# Patient Record
Sex: Male | Born: 1955 | Race: White | Hispanic: No | Marital: Married | Smoking: Former smoker
Health system: Southern US, Community
[De-identification: ages and names within clinical notes are randomized; demographics above are authoritative.]

## PROBLEM LIST (undated history)

## (undated) DIAGNOSIS — D490 Neoplasm of unspecified behavior of digestive system: Secondary | ICD-10-CM

## (undated) DIAGNOSIS — Z720 Tobacco use: Secondary | ICD-10-CM

## (undated) DIAGNOSIS — J939 Pneumothorax, unspecified: Secondary | ICD-10-CM

## (undated) DIAGNOSIS — I2510 Atherosclerotic heart disease of native coronary artery without angina pectoris: Secondary | ICD-10-CM

## (undated) DIAGNOSIS — I219 Acute myocardial infarction, unspecified: Secondary | ICD-10-CM

## (undated) DIAGNOSIS — S0300XA Dislocation of jaw, unspecified side, initial encounter: Secondary | ICD-10-CM

## (undated) DIAGNOSIS — E785 Hyperlipidemia, unspecified: Secondary | ICD-10-CM

## (undated) DIAGNOSIS — I10 Essential (primary) hypertension: Secondary | ICD-10-CM

## (undated) HISTORY — DX: Essential (primary) hypertension: I10

## (undated) HISTORY — DX: Hyperlipidemia, unspecified: E78.5

## (undated) HISTORY — DX: Atherosclerotic heart disease of native coronary artery without angina pectoris: I25.10

## (undated) HISTORY — PX: THORACOTOMY: SHX5074

## (undated) HISTORY — PX: TONSILLECTOMY: SUR1361

## (undated) HISTORY — DX: Tobacco use: Z72.0

---

## 2012-03-24 ENCOUNTER — Encounter (HOSPITAL_COMMUNITY): Payer: Self-pay | Admitting: *Deleted

## 2012-03-24 ENCOUNTER — Inpatient Hospital Stay (HOSPITAL_COMMUNITY)
Admission: EM | Admit: 2012-03-24 | Discharge: 2012-04-01 | DRG: 234 | Disposition: A | Discharge status: Home / Self Care | Payer: 59 | Source: Other Acute Inpatient Hospital | Attending: Cardiothoracic Surgery | Admitting: Cardiothoracic Surgery

## 2012-03-24 DIAGNOSIS — F17200 Nicotine dependence, unspecified, uncomplicated: Secondary | ICD-10-CM | POA: Diagnosis present

## 2012-03-24 DIAGNOSIS — I214 Non-ST elevation (NSTEMI) myocardial infarction: Principal | ICD-10-CM | POA: Diagnosis present

## 2012-03-24 DIAGNOSIS — Z8249 Family history of ischemic heart disease and other diseases of the circulatory system: Secondary | ICD-10-CM

## 2012-03-24 DIAGNOSIS — I498 Other specified cardiac arrhythmias: Secondary | ICD-10-CM | POA: Diagnosis present

## 2012-03-24 DIAGNOSIS — I2510 Atherosclerotic heart disease of native coronary artery without angina pectoris: Secondary | ICD-10-CM | POA: Diagnosis present

## 2012-03-24 DIAGNOSIS — R079 Chest pain, unspecified: Secondary | ICD-10-CM

## 2012-03-24 DIAGNOSIS — E785 Hyperlipidemia, unspecified: Secondary | ICD-10-CM | POA: Diagnosis present

## 2012-03-24 DIAGNOSIS — Z951 Presence of aortocoronary bypass graft: Secondary | ICD-10-CM

## 2012-03-24 DIAGNOSIS — D62 Acute posthemorrhagic anemia: Secondary | ICD-10-CM | POA: Diagnosis not present

## 2012-03-24 HISTORY — DX: Dislocation of jaw, unspecified side, initial encounter: S03.00XA

## 2012-03-24 HISTORY — DX: Pneumothorax, unspecified: J93.9

## 2012-03-24 MED ORDER — SODIUM CHLORIDE 0.9 % IV SOLN
250.0000 mL | INTRAVENOUS | Status: DC | PRN
  Administered 2012-03-24: 23:00:00 via INTRAVENOUS

## 2012-03-24 MED ORDER — SODIUM CHLORIDE 0.9 % IJ SOLN
3.0000 mL | Freq: Two times a day (BID) | INTRAMUSCULAR | Status: DC
  Administered 2012-03-24: 3 mL via INTRAVENOUS

## 2012-03-24 MED ORDER — HEPARIN (PORCINE) IN NACL 100-0.45 UNIT/ML-% IJ SOLN
1400.0000 [IU]/h | INTRAMUSCULAR | Status: DC
  Administered 2012-03-24: 1000 [IU]/h via INTRAVENOUS
  Filled 2012-03-24 (×3): qty 250

## 2012-03-24 MED ORDER — ACETAMINOPHEN 325 MG PO TABS
650.0000 mg | ORAL_TABLET | ORAL | Status: DC | PRN
  Administered 2012-03-25: 650 mg via ORAL
  Filled 2012-03-24: qty 2

## 2012-03-24 MED ORDER — ASPIRIN 81 MG PO CHEW
324.0000 mg | CHEWABLE_TABLET | ORAL | Status: AC
  Administered 2012-03-24: 324 mg via ORAL

## 2012-03-24 MED ORDER — ASPIRIN 81 MG PO CHEW
324.0000 mg | CHEWABLE_TABLET | ORAL | Status: AC
  Administered 2012-03-25: 324 mg via ORAL
  Filled 2012-03-24: qty 4

## 2012-03-24 MED ORDER — ASPIRIN 300 MG RE SUPP
300.0000 mg | RECTAL | Status: AC
  Filled 2012-03-24: qty 1

## 2012-03-24 MED ORDER — PNEUMOCOCCAL VAC POLYVALENT 25 MCG/0.5ML IJ INJ
0.5000 mL | INJECTION | INTRAMUSCULAR | Status: AC
  Filled 2012-03-24: qty 0.5

## 2012-03-24 MED ORDER — BIOTENE DRY MOUTH MT LIQD
15.0000 mL | Freq: Two times a day (BID) | OROMUCOSAL | Status: DC
  Administered 2012-03-24 – 2012-03-26 (×5): 15 mL via OROMUCOSAL

## 2012-03-24 MED ORDER — ASPIRIN EC 81 MG PO TBEC
81.0000 mg | DELAYED_RELEASE_TABLET | Freq: Every day | ORAL | Status: DC
  Administered 2012-03-26: 81 mg via ORAL
  Filled 2012-03-24 (×3): qty 1

## 2012-03-24 MED ORDER — SODIUM CHLORIDE 0.9 % IJ SOLN: 3.0000 mL | INTRAMUSCULAR | Status: DC | PRN

## 2012-03-24 MED ORDER — NITROGLYCERIN IN D5W 200-5 MCG/ML-% IV SOLN
3.0000 ug/min | INTRAVENOUS | Status: DC
Start: 2012-03-24 — End: 2012-03-27
  Administered 2012-03-24: 5 ug/min via INTRAVENOUS
  Filled 2012-03-24: qty 250

## 2012-03-24 MED ORDER — DIPHENHYDRAMINE HCL 25 MG PO CAPS
25.0000 mg | ORAL_CAPSULE | Freq: Every evening | ORAL | Status: DC | PRN
  Administered 2012-03-24 – 2012-03-25 (×2): 25 mg via ORAL
  Filled 2012-03-24 (×2): qty 1

## 2012-03-24 MED ORDER — ATORVASTATIN CALCIUM 80 MG PO TABS
80.0000 mg | ORAL_TABLET | Freq: Every day | ORAL | Status: DC
  Administered 2012-03-25 – 2012-03-31 (×6): 80 mg via ORAL
  Filled 2012-03-24 (×8): qty 1

## 2012-03-24 NOTE — Progress Notes (Signed)
ANTICOAGULATION CONSULT NOTE - Initial Consult  Pharmacy Consult for heparin Indication: chest pain/ACS  No Known Allergies  Patient Measurements: Height: 6\' 1"  (185.4 cm) Weight: 159 lb 6.3 oz (72.3 kg) IBW/kg (Calculated) : 79.9  Heparin Dosing Weight: 72kg  Vital Signs: BP: 125/85 mmHg (09/29 2300) Pulse Rate: 56  (09/29 2300)  Medical History: Past Medical History  Diagnosis Date  . 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 Pneumothorax     Assessment: 56yo male c/o CP x3 days progressively worsening, presented to [facility] where CE all positive though ECG unremarkable, tx'd to [facility] for evaluation for possible NSTEMI, to continue heparin begun there.  Of note no significant PMH except + tobacco abuse, no meds PTA.  Labs from [place]: troponin 1.36, WBC 11.1, H/H 16.3/48.4, Plt 240, glu 78, INR 0.9, SCr 0.88  Goal of Therapy:  Heparin level 0.3-0.7 units/ml Monitor platelets by anticoagulation protocol: Yes   Plan:  [place] gave heparin 5000 units x1 followed by gtt at 1000 units/hr; when pt arrived heparin was turned off due x~1hr due to OSH concentration incompatible w/ [facility] pumps, now to resume heparin at 1000 units/hr and monitor heparin levels and CBC.  Colleen Can PharmD BCPS 03/24/2012,11:14 PM

## 2012-03-24 NOTE — H&P (Signed)
History and Physical  Patient IDBrennan Litzinger Carey MRN: [id number], SOB: 12-24-1955 56 y.o. Date of Encounter: 03/24/2012, 11:28 PM  Primary Physician: Dr. Clelia Croft Primary Cardiologist: None  Chief Complaint: chest pain, xfer due to elevated troponins  HPI: 56 y.o. male w/ PMHx significant for tobacco abuse who presented to [facility] with 3 days of chest pain. Described that it started initially as globus, the sensation of something stuck in his throat. Over the next couple of days it worsened and then today on day of presentation it was typical symptoms of chest pain across the left side of his chest, radiating to his arms, diaphoresis and nausea. Sought care at [facility] and xferred after troponins found to be elevated. Started on heparin and nitro gtt as his initial BP was 180s.  In retrospect, he reports exertional chest discomfort for the last 3 or so years that he has ignored.  Outside labs: Troponin 1.36 CPK 284 MB 29 Fraction 10% BNP 72 D-dimer neg Hct 48 Platelets 240  Chem7 nml.  EKG revealed sinus brady with no acute ST changes. CXR was without acute cardiopulmonary abnormalities.   Past Medical History  Diagnosis Date  . 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 Pneumothorax      Surgical History:  Past Surgical History  Procedure Date  . Thoracotomy     about 10 yrs ago  . Tonsillectomy      Home Meds: Prior to Admission medications   Not on File  Benadryl prn  Allergies: No Known Allergies  History   Social History  . Marital Status: Married    Spouse Name: N/A    Number of Children: N/A  . Years of Education: N/A   Occupational History  . Not on file.   Social History Main Topics  . Smoking status: Current Every Day Smoker -- 1.5 packs/day for 30 years    Types: Cigarettes  . Smokeless tobacco: Never Used  . Alcohol Use: No  . Drug Use: No  . Sexually Active: Yes    Birth Control/ Protection: None   Other Topics Concern    . Not on file   Social History Narrative  . No narrative on file     Family History  Problem Relation Age of Onset  . Heart disease Other     Review of Systems: General: negative for chills, fever, night sweats or weight changes.  Cardiovascular: per HPI.  Dermatological: negative for rash. Multiple scratches from work Respiratory: negative for cough or wheezing Urologic: negative for hematuria Abdominal: negative for nausea, vomiting, diarrhea, bright red blood per rectum, melena, or hematemesis Neurologic: negative for visual changes, syncope, or dizziness All other systems reviewed and are otherwise negative except as noted above.  Labs:  see HPI  Radiology/Studies:  OSH pxcray: biapical scarring, no acute CV process   EKG: sinus brady otherwise wnl  Physical Exam: Blood pressure 125/85, pulse 56, resp. rate 17, height 6\' 1"  (1.854 m), weight 72.3 kg (159 lb 6.3 oz), SpO2 99.00%. General: Well developed, well nourished, in no acute distress. Head: Normocephalic, atraumatic, sclera non-icteric, nares are without discharge Neck: Supple. Negative for carotid bruits. JVD not elevated. Lungs: Clear bilaterally to auscultation without wheezes, rales, or rhonchi. Breathing is unlabored. Heart: RRR with S1 S2. No murmurs, rubs, or gallops appreciated. Abdomen: Soft, non-tender, non-distended with normoactive bowel sounds. No rebound/guarding. No obvious abdominal masses. Msk:  Strength and tone appear normal for age. Extremities: No edema. No clubbing or cyanosis. Distal pedal  pulses are 2+ and equal bilaterally. Neuro: Alert and oriented X 3. Moves all extremities spontaneously. Psych:  Responds to questions appropriately with a normal affect.    Problem List 1. NSTEMI, elevated troponin 2. Tobacco abuse 3. Sinus bradycardia   ASSESSMENT AND PLAN:  56 yo male with cardiac risk factors of family history and tobacco abuse presenting with 3 days of chest pain, acutely  worse today. Found to have elevated troponin consistent NSTEMI.  Will aggressively treat blood pressure with nitro gtt. Sinus bradycardia in the 50s precludes the use of beta blockers.  Anti-coagulation with aspirin, heparin gtt. Hold on clopidogrel until anatomy is known.  Lipids pending. Started high dose atorvastatin.  No contraindications to early invasive strategy with left heart catheterization and possible PCI. NPO, to be prepped and consented.  Signed, Adolm Joseph, Troy Sine MD 03/24/2012, 11:28 PM

## 2012-03-25 ENCOUNTER — Other Ambulatory Visit: Payer: Self-pay | Admitting: *Deleted

## 2012-03-25 ENCOUNTER — Encounter (HOSPITAL_COMMUNITY)
Admission: EM | Disposition: A | Discharge status: Home / Self Care | Payer: Self-pay | Source: Other Acute Inpatient Hospital | Attending: Cardiothoracic Surgery

## 2012-03-25 ENCOUNTER — Encounter (HOSPITAL_COMMUNITY): Payer: Self-pay | Admitting: Cardiology

## 2012-03-25 ENCOUNTER — Encounter (HOSPITAL_COMMUNITY): Payer: 59

## 2012-03-25 DIAGNOSIS — I219 Acute myocardial infarction, unspecified: Secondary | ICD-10-CM

## 2012-03-25 DIAGNOSIS — I359 Nonrheumatic aortic valve disorder, unspecified: Secondary | ICD-10-CM

## 2012-03-25 DIAGNOSIS — I2510 Atherosclerotic heart disease of native coronary artery without angina pectoris: Secondary | ICD-10-CM

## 2012-03-25 DIAGNOSIS — I214 Non-ST elevation (NSTEMI) myocardial infarction: Secondary | ICD-10-CM | POA: Diagnosis present

## 2012-03-25 HISTORY — PX: LEFT HEART CATHETERIZATION WITH CORONARY ANGIOGRAM: SHX5451

## 2012-03-25 LAB — BASIC METABOLIC PANEL
Calcium: 9.1 mg/dL (ref 8.4–10.5)
GFR calc Af Amer: >90 mL/min (ref >90)
GFR calc non Af Amer: >90 mL/min (ref >90)
Glucose, Bld: 93 mg/dL (ref 70–99)
Potassium: 3.6 mEq/L (ref 3.5–5.1)
Sodium: 138 mEq/L (ref 135–145)

## 2012-03-25 LAB — CBC
MCH: 29.6 pg (ref 26.0–34.0)
MCHC: 33.6 g/dL (ref 30.0–36.0)
Platelets: 207 10*3/uL (ref 150–400)
RDW: 13.5 % (ref 11.5–15.5)

## 2012-03-25 LAB — LIPID PANEL
Cholesterol: 246 mg/dL — ABNORMAL HIGH (ref 0–200)
LDL Cholesterol: 177 mg/dL — ABNORMAL HIGH (ref 0–99)
Total CHOL/HDL Ratio: 6.2 RATIO
Triglycerides: 147 mg/dL (ref <150)
VLDL: 29 mg/dL (ref 0–40)

## 2012-03-25 LAB — HEPARIN LEVEL (UNFRACTIONATED): Heparin Unfractionated: 0.15 IU/mL — ABNORMAL LOW (ref 0.30–0.70)

## 2012-03-25 LAB — TROPONIN I: Troponin I: 5.33 ng/mL (ref <0.30)

## 2012-03-25 LAB — MRSA PCR SCREENING: MRSA by PCR: NEGATIVE

## 2012-03-25 SURGERY — LEFT HEART CATHETERIZATION WITH CORONARY ANGIOGRAM
Anesthesia: LOCAL

## 2012-03-25 MED ORDER — LIDOCAINE HCL (PF) 1 % IJ SOLN
INTRAMUSCULAR | Status: AC
  Filled 2012-03-25: qty 30

## 2012-03-25 MED ORDER — HEPARIN (PORCINE) IN NACL 100-0.45 UNIT/ML-% IJ SOLN
1600.0000 [IU]/h | INTRAMUSCULAR | Status: DC
  Administered 2012-03-25: 1400 [IU]/h via INTRAVENOUS
  Administered 2012-03-27: 1600 [IU]/h via INTRAVENOUS
  Filled 2012-03-25 (×4): qty 250

## 2012-03-25 MED ORDER — NITROGLYCERIN 0.2 MG/ML ON CALL CATH LAB
INTRAVENOUS | Status: AC
  Filled 2012-03-25: qty 1

## 2012-03-25 MED ORDER — MIDAZOLAM HCL 2 MG/2ML IJ SOLN
INTRAMUSCULAR | Status: AC
  Filled 2012-03-25: qty 2

## 2012-03-25 MED ORDER — ONDANSETRON HCL 4 MG/2ML IJ SOLN: 4.0000 mg | Freq: Four times a day (QID) | INTRAMUSCULAR | Status: DC | PRN

## 2012-03-25 MED ORDER — SODIUM CHLORIDE 0.9 % IV SOLN
INTRAVENOUS | Status: AC
  Administered 2012-03-25: 12:00:00 via INTRAVENOUS

## 2012-03-25 MED ORDER — HEPARIN (PORCINE) IN NACL 2-0.9 UNIT/ML-% IJ SOLN
INTRAMUSCULAR | Status: AC
  Filled 2012-03-25: qty 1500

## 2012-03-25 MED ORDER — VERAPAMIL HCL 2.5 MG/ML IV SOLN
INTRAVENOUS | Status: AC
  Filled 2012-03-25: qty 2

## 2012-03-25 MED ORDER — FENTANYL CITRATE 0.05 MG/ML IJ SOLN
INTRAMUSCULAR | Status: AC
  Filled 2012-03-25: qty 2

## 2012-03-25 NOTE — CV Procedure (Signed)
   Cardiac Catheterization Procedure Note  Name: Shawn Carey MRN: [id number] DOB: 08/18/1955  Procedure: Left Heart Cath, Selective Coronary Angiography, LV angiography  Indication: NonSTEMI   Procedural details:   The right radial was prepped and draped, but the right radial could not be accessed.  The right groin was prepped, draped, and anesthetized with 1% lidocaine. Using modified Seldinger technique, a 5 French sheath was introduced into the right femoral artery. Standard Judkins catheters were used for coronary angiography and left ventriculography. Catheter exchanges were performed over a guidewire. There were no immediate procedural complications. The patient was transferred to the post catheterization recovery area for further monitoring.  ACT was appropriate for sheath removal.    Procedural Findings: Hemodynamics:    AO 135/80 (102) LV 131/6/11 NO gradient    Coronary angiography: Coronary dominance: right  Left mainstem: No significant obstruction  Left anterior descending (LAD): The LAD courses to the apex.  There is an 80% ulcerative plaque just before the diagonal and septal.  The diagonal has an upward takeoff with also 80% proximal stenosis.  The diagonal is fairly large and bifurcates distally.  The LAD beyond this then opens up, then bifurcates to two smaller branches which represent a distal LAD and diagonal.  There is bifurcational disease of 60-70%.  The twin distal vessels are smaller in caliber.    Left circumflex (LCx): The circumflex has an 80% stenosis at the origin of the OM1.  The OM1 has 99% narrowing with TIMI 1 flow and likely represents the infarct artery.  It is probably graftable.  The second OM is large, and graftable.  There is tandem diffuse plaque of 50% leading into the OM2.   Right coronary artery (RCA): Large caliber vessel.  There is an 80% segmental lesion in the mid and a hypodense, 80% lesion in the acute margin.  There is 60% plaque  between the origins of the PDA and PLA.  kf  Left ventriculography: Left ventricular systolic function is normal, LVEF is estimated at 45-50%, there is mild global hypokinesis.    Final Conclusions:   1.  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  Mild reduction in left ventricular systolic function.    Recommendations: 1.  TCTS consult.    [name] 03/25/2012, 10:46 AM

## 2012-03-25 NOTE — Progress Notes (Signed)
ANTICOAGULATION CONSULT NOTE - Follow Up Consult  Pharmacy Consult for heparin Indication: chest pain/ACS  No Known Allergies  Patient Measurements: Height: 6\' 1"  (185.4 cm) Weight: 159 lb 6.3 oz (72.3 kg) IBW/kg (Calculated) : 79.9  Heparin Dosing Weight: 72kg  Vital Signs: Temp: 98.2 F (36.8 C) (09/30 0349) Temp src: Oral (09/30 0349) BP: 126/83 mmHg (09/30 0400) Pulse Rate: 52  (09/30 0600)  Medical History: Past Medical History  Diagnosis Date  . 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 Pneumothorax     Assessment: 56yo male c/o CP x3 days progressively worsening, presented to [facility] where CE all positive though ECG unremarkable, tx'd to [facility] for evaluation for possible NSTEMI on 03/24/2012 , to continue heparin begun there.  Of note no significant PMH except + tobacco abuse, no meds PTA.  Heparin level reported < goal, infusing without problems, no bleeding noted.  Labs from [place]: troponin 1.36, WBC 11.1, H/H 16.3/48.4, Plt 240, glu 78, INR 0.9, SCr 0.88  Goal of Therapy:  Heparin level 0.3-0.7 units/ml Monitor platelets by anticoagulation protocol: Yes   Plan:  Increase heparin to 1400 units/hr Check heparin level in 6h T[place]ski heparin level and CBC  Thank you for allowing pharmacy to be a part of this patients care team.  Lovenia Kim Pharm.D., BCPS Clinical Pharmacist 03/25/2012 6:10 AM Pager: [phone] Phone: [phone]

## 2012-03-25 NOTE — Care Management Note (Unsigned)
    Page 1 of 1   03/28/2012     1:35:48 PM   CARE MANAGEMENT NOTE 03/28/2012  Patient:  WOOD, NOVACEK   Account Number:  [id number]  Date Initiated:  03/25/2012  Documentation initiated by:  Junius Creamer  Subjective/Objective Assessment:   adm w mi     Action/Plan:   lives w wife   Anticipated DC Date:  04/01/2012   Anticipated DC Plan:  HOME W HOME HEALTH SERVICES      DC Planning Services  CM consult      Choice offered to / List presented to:             Status of service:  In process, will continue to follow Medicare Important Message given?   (If response is "NO", the following Medicare IM given date fields will be blank) Date Medicare IM given:   Date Additional Medicare IM given:    Discharge Disposition:    Per UR Regulation:  Reviewed for med. necessity/level of care/duration of stay  If discussed at Long Length of Stay Meetings, dates discussed:    Comments:  9/30 11:55a debbie dowell rn,bsn [phone]   03/28/12 Taniya Dasher,RN,BSN [phone] PT S/P CABG X 7 ON 03/27/12.  PTA, PT INDEPENDENT, LIVES WITH SPOUSE.  MET WITH PT AND WIFE TO DISUSS DC PLANS. WIFE TO PROVIDE CARE AT DC.  WILL FOLLOW FOR HOME NEEDS AS PT PROGRESSES.

## 2012-03-25 NOTE — H&P (View-Only) (Signed)
SUBJECTIVE:  No chest pain.  No SOB.   PHYSICAL EXAM Filed Vitals:   03/25/12 0400 03/25/12 0500 03/25/12 0600 03/25/12 0700  BP: 126/83     Pulse: 51 61 52 50  Temp:      TempSrc:      Resp: 17 17 18 15   Height:      Weight:      SpO2: 99% 98% 98% 99%   General:  No distress Lungs:  Clear Heart:  RRR, no rub Abdomen:  Positive bowel sounds, no rebound no guarding Extremities:  No edema.    LABS: Lab Results  Component Value Date   TROPONINI 5.33* 03/25/2012   Results for orders placed during the hospital encounter of 03/24/12 (from the past 24 hour(s))  MRSA PCR SCREENING     Status: Normal   Collection Time   03/24/12 11:08 PM      Component Value Range   MRSA by PCR NEGATIVE  NEGATIVE  TROPONIN I     Status: Abnormal   Collection Time   03/24/12 11:14 PM      Component Value Range   Troponin I 3.96 (*) <0.30 ng/mL  APTT     Status: Abnormal   Collection Time   03/24/12 11:14 PM      Component Value Range   aPTT 71 (*) 24 - 37 seconds  TROPONIN I     Status: Abnormal   Collection Time   03/25/12  4:35 AM      Component Value Range   Troponin I 5.33 (*) <0.30 ng/mL  LIPID PANEL     Status: Abnormal   Collection Time   03/25/12  4:35 AM      Component Value Range   Cholesterol 246 (*) 0 - 200 mg/dL   Triglycerides 413  <244 mg/dL   HDL 40  >01 mg/dL   Total CHOL/HDL Ratio 6.2     VLDL 29  0 - 40 mg/dL   LDL Cholesterol 027 (*) 0 - 99 mg/dL  CBC     Status: Normal   Collection Time   03/25/12  4:35 AM      Component Value Range   WBC 9.7  4.0 - 10.5 K/uL   RBC 4.93  4.22 - 5.81 MIL/uL   Hemoglobin 14.6  13.0 - 17.0 g/dL   HCT 25.3  66.4 - 40.3 %   MCV 88.2  78.0 - 100.0 fL   MCH 29.6  26.0 - 34.0 pg   MCHC 33.6  30.0 - 36.0 g/dL   RDW 47.4  25.9 - 56.3 %   Platelets 207  150 - 400 K/uL  BASIC METABOLIC PANEL     Status: Normal   Collection Time   03/25/12  4:35 AM      Component Value Range   Sodium 138  135 - 145 mEq/L   Potassium 3.6  3.5 - 5.1  mEq/L   Chloride 103  96 - 112 mEq/L   CO2 27  19 - 32 mEq/L   Glucose, Bld 93  70 - 99 mg/dL   BUN 12  6 - 23 mg/dL   Creatinine, Ser 8.75  0.50 - 1.35 mg/dL   Calcium 9.1  8.4 - 64.3 mg/dL   GFR calc non Af Amer >90  >90 mL/min   GFR calc Af Amer >90  >90 mL/min  HEPARIN LEVEL (UNFRACTIONATED)     Status: Abnormal   Collection Time   03/25/12  4:35 AM  Component Value Range   Heparin Unfractionated 0.15 (*) 0.30 - 0.70 IU/mL    Intake/Output Summary (Last 24 hours) at 03/25/12 0811 Last data filed at 03/25/12 0700  Gross per 24 hour  Intake  158.6 ml  Output    900 ml  Net -741.4 ml    EKG:  Bradycardia.  Rate 51.  No acute changes.    ASSESSMENT AND PLAN:  1. NSTEMI, elevated troponin:  No further pain.  Cath today. The patient understands that risks included but are not limited to stroke (1 in 1000), death (1 in 1000), kidney failure [usually temporary] (1 in 500), bleeding (1 in 200), allergic reaction [possibly serious] (1 in 200).  The patient understands and agrees to proceed.   2. Tobacco abuse :  Educated  3. Sinus bradycardia:  No symptoms related to this.  Avoiding beta blockers.  4.  Hyperlipidemia:  LDL 177.  Started on statin.      Fayrene Fearing [facility] 03/25/2012 8:11 AM

## 2012-03-25 NOTE — Progress Notes (Signed)
ANTICOAGULATION CONSULT NOTE - Follow Up Consult  Pharmacy Consult for heparin Indication: chest pain/ACS  No Known Allergies  Patient Measurements: Height: 6\' 1"  (185.4 cm) Weight: 159 lb 6.3 oz (72.3 kg) IBW/kg (Calculated) : 79.9  Heparin Dosing Weight: 72kg  Vital Signs: Temp: 97.8 F (36.6 C) (09/30 1115) Temp src: Oral (09/30 1115) BP: 130/79 mmHg (09/30 1115) Pulse Rate: 51  (09/30 1115)  Medical History: Past Medical History  Diagnosis Date  . 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 Pneumothorax     Assessment: 56yo male c/o CP x3 days progressively worsening, presented to [facility] where CE all positive though ECG unremarkable, tx'd to [facility] for evaluation for possible NSTEMI on 03/24/2012 , to continue heparin begun there.  Of note no significant PMH except + tobacco abuse, no meds PTA. Patient is now s/p LHC indicative of severe three vessel CAD with complex LAD bifurcation disease. Planning for TCTS consult  Heparin level reported < goal (rate adjusted to 1400units after level resulted), infusing without problems, no bleeding noted.  Labs from [place]: troponin 1.36, WBC 11.1, H/H 16.3/48.4, Plt 240, glu 78, INR 0.9, SCr 0.88  Goal of Therapy:  Heparin level 0.3-0.5 Monitor platelets by anticoagulation protocol: Yes   Plan:  -Resume heparin at 1400 units/hr 8 hours post sheath removal (18:30) -Check heparin level in 6h -[place]awaz heparin level and CBC  Thank you for allowing pharmacy to be a part of this patients care team.  Franchot Erichsen, Pharm.D. Clinical Pharmacist   Pager: [phone] 03/25/2012 11:35 AM

## 2012-03-25 NOTE — Progress Notes (Signed)
 SUBJECTIVE:  No chest pain.  No SOB.   PHYSICAL EXAM Filed Vitals:   03/25/12 0400 03/25/12 0500 03/25/12 0600 03/25/12 0700  BP: 126/83     Pulse: 51 61 52 50  Temp:      TempSrc:      Resp: 17 17 18 15  Height:      Weight:      SpO2: 99% 98% 98% 99%   General:  No distress Lungs:  Clear Heart:  RRR, no rub Abdomen:  Positive bowel sounds, no rebound no guarding Extremities:  No edema.    LABS: Lab Results  Component Value Date   TROPONINI 5.33* 03/25/2012   Results for orders placed during the hospital encounter of 03/24/12 (from the past 24 hour(s))  MRSA PCR SCREENING     Status: Normal   Collection Time   03/24/12 11:08 PM      Component Value Range   MRSA by PCR NEGATIVE  NEGATIVE  TROPONIN I     Status: Abnormal   Collection Time   03/24/12 11:14 PM      Component Value Range   Troponin I 3.96 (*) <0.30 ng/mL  APTT     Status: Abnormal   Collection Time   03/24/12 11:14 PM      Component Value Range   aPTT 71 (*) 24 - 37 seconds  TROPONIN I     Status: Abnormal   Collection Time   03/25/12  4:35 AM      Component Value Range   Troponin I 5.33 (*) <0.30 ng/mL  LIPID PANEL     Status: Abnormal   Collection Time   03/25/12  4:35 AM      Component Value Range   Cholesterol 246 (*) 0 - 200 mg/dL   Triglycerides 147  <150 mg/dL   HDL 40  >39 mg/dL   Total CHOL/HDL Ratio 6.2     VLDL 29  0 - 40 mg/dL   LDL Cholesterol 177 (*) 0 - 99 mg/dL  CBC     Status: Normal   Collection Time   03/25/12  4:35 AM      Component Value Range   WBC 9.7  4.0 - 10.5 K/uL   RBC 4.93  4.22 - 5.81 MIL/uL   Hemoglobin 14.6  13.0 - 17.0 g/dL   HCT 43.5  39.0 - 52.0 %   MCV 88.2  78.0 - 100.0 fL   MCH 29.6  26.0 - 34.0 pg   MCHC 33.6  30.0 - 36.0 g/dL   RDW 13.5  11.5 - 15.5 %   Platelets 207  150 - 400 K/uL  BASIC METABOLIC PANEL     Status: Normal   Collection Time   03/25/12  4:35 AM      Component Value Range   Sodium 138  135 - 145 mEq/L   Potassium 3.6  3.5 - 5.1  mEq/L   Chloride 103  96 - 112 mEq/L   CO2 27  19 - 32 mEq/L   Glucose, Bld 93  70 - 99 mg/dL   BUN 12  6 - 23 mg/dL   Creatinine, Ser 0.84  0.50 - 1.35 mg/dL   Calcium 9.1  8.4 - 10.5 mg/dL   GFR calc non Af Amer >90  >90 mL/min   GFR calc Af Amer >90  >90 mL/min  HEPARIN LEVEL (UNFRACTIONATED)     Status: Abnormal   Collection Time   03/25/12  4:35 AM        Component Value Range   Heparin Unfractionated 0.15 (*) 0.30 - 0.70 IU/mL    Intake/Output Summary (Last 24 hours) at 03/25/12 0811 Last data filed at 03/25/12 0700  Gross per 24 hour  Intake  158.6 ml  Output    900 ml  Net -741.4 ml    EKG:  Bradycardia.  Rate 51.  No acute changes.    ASSESSMENT AND PLAN:  1. NSTEMI, elevated troponin:  No further pain.  Cath today. The patient understands that risks included but are not limited to stroke (1 in 1000), death (1 in 1000), kidney failure [usually temporary] (1 in 500), bleeding (1 in 200), allergic reaction [possibly serious] (1 in 200).  The patient understands and agrees to proceed.   2. Tobacco abuse :  Educated  3. Sinus bradycardia:  No symptoms related to this.  Avoiding beta blockers.  4.  Hyperlipidemia:  LDL 177.  Started on statin.      Shawn Carey 03/25/2012 8:11 AM   

## 2012-03-25 NOTE — Interval H&P Note (Signed)
History and Physical Interval Note:  03/25/2012 9:18 AM  Shawn Carey  has presented today for surgery, with the diagnosis of cp  The various methods of treatment have been discussed with the patient. After consideration of risks, benefits and other options for treatment, the patient has consented to  Procedure(s) (LRB) with comments: LEFT HEART CATHETERIZATION WITH CORONARY ANGIOGRAM (N/A) as a surgical intervention .  The patient's history has been reviewed, patient examined, no change in status, stable for surgery.  I have reviewed the patient's chart and labs.  Questions were answered to the patient's satisfaction.    History reviewed and case discussed with patient.  He has a normal ECG, but pos enzymes consistent with NSTEMI.  Cath is indicated for definition of his anatomy.  He is agreeable.     [name]

## 2012-03-25 NOTE — Progress Notes (Signed)
Stable post cath.  Films reviewed with EG.  Plan is for CABG on Wednesday.  Patient is agreeable.

## 2012-03-25 NOTE — Progress Notes (Signed)
Pt received from [facility] awake and alert, denies cp @ present. Cardiac monitor on, Initial assessment and database completed. Dr Adolm Joseph @ bs. Aware of NPO status, for cath in am. Wife called and updated. Call light within reach, will monitor.

## 2012-03-25 NOTE — Consult Note (Signed)
[address]            Shawn [place] [address]          [phone]    PCP is Dr Clelia Croft, [facility] Referring Provider is Dr Riley Kill, admitted by Dr Tenny Craw  History of Presenting Illness: This is a 56 year old Caucasian male with a past medical history significant for tobacco abuse who presented to [facility] on 03/24/2012 with complaints 3 days of chest pain. He stated that it started initially as the sensation of something stuck in his throat and some radiation to his neck. Over the next couple of days, it worsened and then on the day of presentation, it was  chest pain across the left side of his chest, radiating to his arms, diaphoresis and nausea .In retrospect, he reported exertional chest discomfort for the last 3 or so years that he has ignored. He initially sought care at [facility]. Cardiac enzymes showed a Troponin I 1.36, CPK MB 29.6, relative index 10.4, and total CPK 284. after troponins found to be elevated. He ruled in for a NSTEMI. He was then transferred to [facility] for further evaluation and treatment.He was started on heparin and nitro gtt as his initial BP was 180s.  He undersent a cardiac catheterization by Dr. Riley Kill earlier today. He was found to have three vessel coronary artery disease. A cardiothoracic consultation was requested for consideration of coronary artery bypass surgery. Currently, his vital signs are stable and he denies chest pain or shortness of breath.   Past Medical History  Diagnosis Date  . 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 Pneumothorax     Past Surgical History  Procedure Date  . Chest Tube placement left chest     about 10 yrs ago  . Tonsillectomy     Family History  Problem Relation Age of Onset  . Heart disease Maternal grandmother   Mother is deceased and she had diabetes His father's medical history is not known  Social History History  Substance Use Topics  . Smoking  status: Current Every Day Smoker -- 1.5 packs/day for 30 years    Types: Cigarettes  . Smokeless tobacco: Never Used  . Alcohol Use: No    Current Facility-Administered Medications  Medication Dose Route Frequency Provider Last Rate Last Dose  . 0.9 %  sodium chloride infusion   Intravenous Continuous [name], MD      . acetaminophen (TYLENOL) tablet 650 mg  650 mg Oral Q4H PRN Ardis Rowan, MD      . antiseptic oral rinse (BIOTENE) solution 15 mL  15 mL Mouth Rinse BID Pricilla Riffle, MD   15 mL at 03/25/12 0800  . aspirin chewable tablet 324 mg  324 mg Oral Pre-Cath Ardis Rowan, MD   324 mg at 03/25/12 0530  . aspirin chewable tablet 324 mg  324 mg Oral NOW Ardis Rowan, MD   324 mg at 03/24/12 2315   Or  . aspirin suppository 300 mg  300 mg Rectal NOW Ardis Rowan, MD      . aspirin EC tablet 81 mg  81 mg Oral Barcenas Ardis Rowan, MD      . atorvastatin (LIPITOR) tablet 80 mg  80 mg Oral q1800 Ardis Rowan, MD      . diphenhydrAMINE (BENADRYL) capsule 25 mg  25 mg Oral QHS PRN Molli Hazard  Adolm Joseph, MD   25 mg at 03/24/12 2325  . fentaNYL (SUBLIMAZE) 0.05 MG/ML injection           . heparin 2-0.9 UNIT/ML-% infusion           . heparin ADULT infusion 100 units/mL (25000 units/250 mL)  1,400 Units/hr Intravenous Continuous Pricilla Riffle, MD      . lidocaine (XYLOCAINE) 1 % injection           . midazolam (VERSED) 2 MG/2ML injection           . nitroGLYCERIN (NTG ON-CALL) 0.2 mg/mL injection           . nitroGLYCERIN 0.2 mg/mL in dextrose 5 % infusion  3-30 mcg/min Intravenous Titrated Ardis Rowan, MD 1.5 mL/hr at 03/24/12 2314 5 mcg/min at 03/24/12 2314  . ondansetron (ZOFRAN) injection 4 mg  4 mg Intravenous Q6H PRN [name], MD      . pneumococcal 23 valent vaccine (PNU-IMMUNE) injection 0.5 mL  0.5 mL Intramuscular Tomorrow-1000 Pricilla Riffle, MD      . verapamil (ISOPTIN) 2.5 MG/ML injection             Allergies:No Known Allergies  Review of  Systems:  General: negative for chills, fever, night sweats or weight changes.  Cardiovascular: Denies orthopnea, denies pnd, denies palpitations, and denies pedal edema,  Dermatological: Negative for rash. Multiple scratches from work  Respiratory: Denies resting shortness of breath.Negative for cough or wheezing.Has some exertional shortness of breath. Urologic: Negative for hematuria  Abdominal: Negative for nausea, vomiting, diarrhea, bright red blood per rectum, melena, or hematemesis  Neurologic: Negative for visual changes, pre syncope or syncope, or dizziness  All other systems reviewed and are otherwise negative except as noted above.   BP 128/80  Pulse 63  Temp 97.8 F (36.6 C) (Oral)  Resp 20  Ht 6\' 1"  (1.854 m)  Wt 159 lb 6.3 oz (72.3 kg)  BMI 21.03 kg/m2  SpO2 98%  Physical Exam: General: Well developed, well nourished, in no acute distress.  Head: Normocephalic, atraumatic, sclera non-icteric,EMOI, PERRLA  Neck: Supple. Negative for carotid bruits. JVD not elevated.  Lungs: Clear  to auscultation bilaterally;No wheezes, rales, or rhonchi. Breathing is unlabored.  Heart: RRR with S1 S2. No murmurs, rubs, or gallops  Abdomen: Soft, non-tender, non-distended with normoactive bowel sounds. No rebound/guarding. No obvious abdominal masses.  Msk: Strength and tone appear normal for age.  Extremities: No edema. No clubbing or cyanosis. Dorsalis pedis and posterior tibialis pulses are 2+ and equal bilaterally.  Neuro: Alert and oriented X 3. Cranial nerves II-XII are grossly intact without focal deficits.   Diagnostic Tests: Procedure: Left Heart Cath, Selective Coronary Angiography, LV angiography  Indication: NonSTEMI  Procedural details: The right radial was prepped and draped, but the right radial could not be accessed. The right groin was prepped, draped, and anesthetized with 1% lidocaine. Using modified Seldinger technique, a 5 French sheath was introduced into the  right femoral artery. Standard Judkins catheters were used for coronary angiography and left ventriculography. Catheter exchanges were performed over a guidewire. There were no immediate procedural complications. The patient was transferred to the post catheterization recovery area for further monitoring. ACT was appropriate for sheath removal.  Procedural Findings:  Hemodynamics:  AO 135/80 (102)  LV 131/6/11  NO gradient  Coronary angiography:  Coronary dominance: right  Left mainstem: No significant obstruction  Left anterior descending (LAD): The LAD courses to the apex. There is an  80% ulcerative plaque just before the diagonal and septal. The diagonal has an upward takeoff with also 80% proximal stenosis. The diagonal is fairly large and bifurcates distally. The LAD beyond this then opens up, then bifurcates to two smaller branches which represent a distal LAD and diagonal. There is bifurcational disease of 60-70%. The twin distal vessels are smaller in caliber.  Left circumflex (LCx): The circumflex has an 80% stenosis at the origin of the OM1. The OM1 has 99% narrowing with TIMI 1 flow and likely represents the infarct artery. It is probably graftable. The second OM is large, and graftable. There is tandem diffuse plaque of 50% leading into the OM2.  Right coronary artery (RCA): Large caliber vessel. There is an 80% segmental lesion in the mid and a hypodense, 80% lesion in the acute margin. There is 60% plaque between the origins of the PDA and PLA. kf  Left ventriculography: Left ventricular systolic function is normal, LVEF is estimated at 45-50%, there is mild global hypokinesis.  Final Conclusions:  1. Severe three vessel CAD with complex LAD bifurcation disease  2. Mild reduction in left ventricular systolic function.  Recommendations:  1. TCTS consult.  Shawn Carey  03/25/2012, 10:46 AM  Lab Results  Component Value Date   WBC 9.7 03/25/2012   HGB 14.6 03/25/2012   HCT 43.5  03/25/2012   PLT 207 03/25/2012   GLUCOSE 93 03/25/2012   CHOL 246* 03/25/2012   TRIG 147 03/25/2012   HDL 40 03/25/2012   LDLCALC 177* 03/25/2012   NA 138 03/25/2012   K 3.6 03/25/2012   CL 103 03/25/2012   CREATININE 0.84 03/25/2012   BUN 12 03/25/2012   CO2 27 03/25/2012   Lab Results  Component Value Date   TROPONINI 3.24* 03/25/2012    Impression and Plan:  1.S/p NSTEMI 2.Multivessel CAD 3.CABG on Wednesday  I have discussed with the patient the recommendation for CABG with multivessel disease and need for 6-7 bypasses. The goals risks and alternatives of the planned surgical procedureCABG have been discussed with the patient in detail. The risks of the procedure including death, infection, stroke, myocardial infarction, bleeding, blood transfusion have all been discussed specifically.  I have quoted Shawn Carey a 2% of perioperative mortality and a complication rate as high as 20 %. The patient's questions have been answered.Shawn Carey is willing  to proceed with the planned procedure.    Delight Ovens MD  Beeper [phone] Office [phone] 03/25/2012 5:23 PM

## 2012-03-25 NOTE — Progress Notes (Signed)
CRITICAL VALUE ALERT  Critical value received: Troponin = 3.96  Date of notification:  03/25/12  Time of notification: 0020  Critical value read back Yes  Nurse who received alert:O. Delford Field, RN  MD notified (1st page): On  Call MD  Time of first page: 0030 MD notified (2nd page):  Time of second page:  Responding MD:Dr.Whitlock Time MD responded: [phone]

## 2012-03-26 ENCOUNTER — Inpatient Hospital Stay (HOSPITAL_COMMUNITY): Payer: 59

## 2012-03-26 DIAGNOSIS — R079 Chest pain, unspecified: Secondary | ICD-10-CM

## 2012-03-26 DIAGNOSIS — I2510 Atherosclerotic heart disease of native coronary artery without angina pectoris: Secondary | ICD-10-CM

## 2012-03-26 DIAGNOSIS — Z01810 Encounter for preprocedural cardiovascular examination: Secondary | ICD-10-CM

## 2012-03-26 LAB — URINALYSIS, ROUTINE W REFLEX MICROSCOPIC
Bilirubin Urine: NEGATIVE
Glucose, UA: NEGATIVE mg/dL
Hgb urine dipstick: NEGATIVE
Ketones, ur: NEGATIVE mg/dL
Leukocytes, UA: NEGATIVE
Nitrite: NEGATIVE
Protein, ur: NEGATIVE mg/dL
Specific Gravity, Urine: 1.013 (ref 1.005–1.030)
Urobilinogen, UA: 0.2 mg/dL (ref 0.0–1.0)
pH: 6 (ref 5.0–8.0)

## 2012-03-26 LAB — PROTIME-INR
INR: 0.85 (ref 0.00–1.49)
Prothrombin Time: 11.6 seconds (ref 11.6–15.2)

## 2012-03-26 LAB — CBC
HCT: 42.8 % (ref 39.0–52.0)
Hemoglobin: 15.2 g/dL (ref 13.0–17.0)
MCH: 30.7 pg (ref 26.0–34.0)
MCV: 90.3 fL (ref 78.0–100.0)
MCV: 90.5 fL (ref 78.0–100.0)
Platelets: 207 10*3/uL (ref 150–400)
RBC: 4.74 MIL/uL (ref 4.22–5.81)
RBC: 4.95 MIL/uL (ref 4.22–5.81)
WBC: 10.5 10*3/uL (ref 4.0–10.5)

## 2012-03-26 LAB — COMPREHENSIVE METABOLIC PANEL
ALT: 14 U/L (ref 0–53)
AST: 26 U/L (ref 0–37)
Albumin: 3.4 g/dL — ABNORMAL LOW (ref 3.5–5.2)
Alkaline Phosphatase: 104 U/L (ref 39–117)
BUN: 14 mg/dL (ref 6–23)
CO2: 26 mEq/L (ref 19–32)
Calcium: 9.4 mg/dL (ref 8.4–10.5)
Chloride: 103 mEq/L (ref 96–112)
Creatinine, Ser: 1.03 mg/dL (ref 0.50–1.35)
GFR calc Af Amer: >90 mL/min (ref >90)
GFR calc non Af Amer: 81 mL/min — ABNORMAL LOW (ref >90)
Glucose, Bld: 102 mg/dL — ABNORMAL HIGH (ref 70–99)
Potassium: 3.6 mEq/L (ref 3.5–5.1)
Sodium: 139 mEq/L (ref 135–145)
Total Bilirubin: 0.2 mg/dL — ABNORMAL LOW (ref 0.3–1.2)
Total Protein: 7 g/dL (ref 6.0–8.3)

## 2012-03-26 LAB — POCT I-STAT 3, ART BLOOD GAS (G3+)
TCO2: 26 mmol/L (ref 0–100)
pCO2 arterial: 38.5 mmHg (ref 35.0–45.0)
pH, Arterial: 7.418 (ref 7.350–7.450)

## 2012-03-26 LAB — ABO/RH: ABO/RH(D): O POS

## 2012-03-26 LAB — TYPE AND SCREEN
ABO/RH(D): O POS
Antibody Screen: NEGATIVE

## 2012-03-26 LAB — HEPARIN LEVEL (UNFRACTIONATED): Heparin Unfractionated: 0.41 IU/mL (ref 0.30–0.70)

## 2012-03-26 LAB — APTT: aPTT: 69 seconds — ABNORMAL HIGH (ref 24–37)

## 2012-03-26 MED ORDER — VANCOMYCIN HCL 1000 MG IV SOLR
1250.0000 mg | INTRAVENOUS | Status: AC
  Administered 2012-03-27: 1250 mg via INTRAVENOUS
  Filled 2012-03-26: qty 1250

## 2012-03-26 MED ORDER — SODIUM CHLORIDE 0.9 % IV SOLN
INTRAVENOUS | Status: AC
  Administered 2012-03-27 (×2): 14 mL/h via INTRAVENOUS
  Filled 2012-03-26: qty 40

## 2012-03-26 MED ORDER — PHENYLEPHRINE HCL 10 MG/ML IJ SOLN
30.0000 ug/min | INTRAVENOUS | Status: DC
  Filled 2012-03-26: qty 2

## 2012-03-26 MED ORDER — POTASSIUM CHLORIDE 2 MEQ/ML IV SOLN
80.0000 meq | INTRAVENOUS | Status: DC
  Filled 2012-03-26: qty 40

## 2012-03-26 MED ORDER — EPINEPHRINE HCL 1 MG/ML IJ SOLN
0.5000 ug/min | INTRAVENOUS | Status: DC
  Filled 2012-03-26: qty 4

## 2012-03-26 MED ORDER — DOPAMINE-DEXTROSE 3.2-5 MG/ML-% IV SOLN
2.0000 ug/kg/min | INTRAVENOUS | Status: DC
  Filled 2012-03-26: qty 250

## 2012-03-26 MED ORDER — ALBUTEROL SULFATE (5 MG/ML) 0.5% IN NEBU
2.5000 mg | INHALATION_SOLUTION | Freq: Once | RESPIRATORY_TRACT | Status: AC
  Administered 2012-03-26: 2.5 mg via RESPIRATORY_TRACT

## 2012-03-26 MED ORDER — SODIUM CHLORIDE 0.9 % IV SOLN
INTRAVENOUS | Status: AC
  Administered 2012-03-27: .9 [IU]/h via INTRAVENOUS
  Filled 2012-03-26: qty 1

## 2012-03-26 MED ORDER — NITROGLYCERIN IN D5W 200-5 MCG/ML-% IV SOLN
2.0000 ug/min | INTRAVENOUS | Status: DC
  Filled 2012-03-26: qty 250

## 2012-03-26 MED ORDER — METOPROLOL TARTRATE 12.5 MG HALF TABLET
12.5000 mg | ORAL_TABLET | Freq: Once | ORAL | Status: AC
  Administered 2012-03-27: 12.5 mg via ORAL
  Filled 2012-03-26: qty 1

## 2012-03-26 MED ORDER — DEXMEDETOMIDINE HCL IN NACL 400 MCG/100ML IV SOLN
0.1000 ug/kg/h | INTRAVENOUS | Status: DC
  Filled 2012-03-26: qty 100

## 2012-03-26 MED ORDER — MAGNESIUM SULFATE 50 % IJ SOLN
40.0000 meq | INTRAMUSCULAR | Status: DC
  Filled 2012-03-26: qty 10

## 2012-03-26 MED ORDER — BISACODYL 5 MG PO TBEC
5.0000 mg | DELAYED_RELEASE_TABLET | Freq: Once | ORAL | Status: AC
  Administered 2012-03-26: 5 mg via ORAL
  Filled 2012-03-26: qty 1

## 2012-03-26 MED ORDER — PLASMA-LYTE 148 IV SOLN
INTRAVENOUS | Status: AC
  Administered 2012-03-27: 10:00:00
  Filled 2012-03-26: qty 2.5

## 2012-03-26 MED ORDER — CEFUROXIME SODIUM 1.5 G IJ SOLR
1.5000 g | INTRAMUSCULAR | Status: AC
  Administered 2012-03-27: 1.5 g via INTRAVENOUS
  Administered 2012-03-27: .75 g via INTRAVENOUS
  Filled 2012-03-26: qty 1.5

## 2012-03-26 MED ORDER — CHLORHEXIDINE GLUCONATE 4 % EX LIQD
60.0000 mL | Freq: Once | CUTANEOUS | Status: AC
  Administered 2012-03-27: 4 via TOPICAL
  Filled 2012-03-26: qty 60

## 2012-03-26 MED ORDER — TEMAZEPAM 15 MG PO CAPS
15.0000 mg | ORAL_CAPSULE | Freq: Once | ORAL | Status: AC | PRN
  Administered 2012-03-26: 15 mg via ORAL
  Filled 2012-03-26: qty 1

## 2012-03-26 MED ORDER — DEXTROSE 5 % IV SOLN
750.0000 mg | INTRAVENOUS | Status: DC
  Filled 2012-03-26 (×2): qty 750

## 2012-03-26 NOTE — Progress Notes (Signed)
Subjective:  Awaiting surgery tomorrow.  Findings reviewed in detail yesterday with family. Doing well.  No chest pain.    Objective:  Vital Signs in the last 24 hours: Temp:  [97.5 F (36.4 C)-98.5 F (36.9 C)] 97.5 F (36.4 C) (10/01 0329) Pulse Rate:  [48-68] 50  (10/01 0329) Resp:  [13-21] 18  (10/01 0329) BP: (117-135)/(70-82) 117/77 mmHg (10/01 0329) SpO2:  [95 %-100 %] 98 % (10/01 0329)  Intake/Output from previous day: 09/30 0701 - 10/01 0700 In: 1790.8 [P.O.:680; I.V.:1110.8] Out: 2250 [Urine:2250]   Physical Exam: General: Well developed, well nourished, in no acute distress. Head:  Normocephalic and atraumatic. Lungs: Marked decrease in BS.  (patient in resp therapy for PFT) Heart: Normal S1 and S2.  No murmur, rubs or gallops.  Pulses: Pulses normal in all 4 extremities. Groin looks good.   Extremities: No clubbing or cyanosis. No edema. Neurologic: Alert and oriented x 3.    Lab Results:  Basename 03/26/12 0030 03/25/12 0435  WBC 10.5 9.7  HGB 14.1 14.6  PLT 207 207    Basename 03/25/12 0435  NA 138  K 3.6  CL 103  CO2 27  GLUCOSE 93  BUN 12  CREATININE 0.84    Basename 03/25/12 1317 03/25/12 0435  TROPONINI 3.24* 5.33*   Hepatic Function Panel No results found for this basename: PROT,ALBUMIN,AST,ALT,ALKPHOS,BILITOT,BILIDIR,IBILI in the last 72 hours  Basename 03/25/12 0435  CHOL 246*   No results found for this basename: PROTIME in the last 72 hours  Imaging: No results found.    Assessment/Plan:  Patient Active Hospital Problem List: MI, acute, non ST segment elevation (03/25/2012)   Assessment: severe three vessel CAD, awaiting CABG tomorrow   Plan: multiple studies today.  Patient understands need for CABG.       [name], MD, [facility], FSCAI 03/26/2012, 8:08 AM

## 2012-03-26 NOTE — Progress Notes (Addendum)
VASCULAR LAB PRELIMINARY  PRELIMINARY  PRELIMINARY  PRELIMINARY  Pre-op Cardiac Surgery  Carotid Findings:  Bilateral:  No evidence of hemodynamically significant internal carotid artery stenosis.   Vertebral artery flow is antegrade.      Upper Extremity Right Left  Brachial Pressures 155 Triphasic 157 Triphasic  Radial Waveforms Triphasic Triphasic  Ulnar Waveforms Triphasic Triphasic  Palmar Arch (Allen's Test) Normal Normal   Findings: Doppler waveforms remained normal bilaterally with both radial and ulnar compressions                           Findings:  Palpable pedal pulses bilaterally   BIGGS, SANDRA, 03/26/2012, 10:36 AM  Graysin Luczynski RVS 03/26/2012, 7:06 PM

## 2012-03-26 NOTE — Progress Notes (Signed)
ANTICOAGULATION CONSULT NOTE - Follow Up Consult  Pharmacy Consult for heparin Indication: CAD awaiting CABG  Labs:  Basename 03/26/12 0030 03/26/12 0003 03/25/12 1317 03/25/12 0435 03/24/12 2314  HGB 14.1 -- -- 14.6 --  HCT 42.8 -- -- 43.5 --  PLT 207 -- -- 207 --  APTT -- -- -- -- 71*  LABPROT -- -- -- -- --  INR -- -- -- -- --  HEPARINUNFRC -- 0.16* -- 0.15* --  CREATININE -- -- -- 0.84 --  CKTOTAL -- -- -- -- --  CKMB -- -- -- -- --  TROPONINI -- -- 3.24* 5.33* 3.96*    Assessment: 56yo male subtherapeutic on heparin after resumed post-cath, now awaiting CABG.  Goal of Therapy:  Heparin level 0.3-0.5 units/ml   Plan:  Will increase heparin gtt by ~3 units/kg/hr to 1600 units/hr and check level in 6hr.  Colleen Can PharmD BCPS 03/26/2012,1:37 AM

## 2012-03-26 NOTE — Progress Notes (Signed)
INITIAL ADULT NUTRITION ASSESSMENT Date: 03/26/2012   Time: 9:58 AM Reason for Assessment: MST (Malnutrition Screening Tool)   INTERVENTION: 1. Encouraged snack as needed from unit.  2. RD will continue to follow    DOCUMENTATION CODES Per approved criteria  -Not Applicable    ASSESSMENT: Male 56 y.o.  Dx: MI, acute, non ST segment elevation  Hx:  Past Medical History  Diagnosis Date  . 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 Pneumothorax     Past Surgical History  Procedure Date  . Thoracotomy     about 10 yrs ago  . Tonsillectomy     Related Meds:     . albuterol  2.5 mg Nebulization Once  . antiseptic oral rinse  15 mL Mouth Rinse BID  . aspirin EC  81 mg Oral [place]  . atorvastatin  80 mg Oral q1800  . pneumococcal 23 valent vaccine  0.5 mL Intramuscular Tomorrow-1000  . DISCONTD: sodium chloride  3 mL Intravenous Q12H     Ht: 6\' 1"  (185.4 cm)  Wt: 159 lb 6.3 oz (72.3 kg)  Ideal Wt: 83.6 kg  % Ideal Wt: 86%  Usual Wt: 189 lbs per pt report, about 1 year ago  Wt Readings from Last 5 Encounters:  03/24/12 159 lb 6.3 oz (72.3 kg)  03/24/12 159 lb 6.3 oz (72.3 kg)    % Usual Wt: 84%  Body mass index is 21.03 kg/(m^2). Pt is WNL per current BMI   Food/Nutrition Related Hx: Pt indicates unintentional weight loss with out poor appetite per MST (Malnutrition Screening Tool)   Labs:  CMP     Component Value Date/Time   NA 138 03/25/2012 0435   K 3.6 03/25/2012 0435   CL 103 03/25/2012 0435   CO2 27 03/25/2012 0435   GLUCOSE 93 03/25/2012 0435   BUN 12 03/25/2012 0435   CREATININE 0.84 03/25/2012 0435   CALCIUM 9.1 03/25/2012 0435   GFRNONAA >90 03/25/2012 0435   GFRAA >90 03/25/2012 0435    Intake/Output Summary (Last 24 hours) at 03/26/12 0959 Last data filed at 03/26/12 0940  Gross per 24 hour  Intake 2174.76 ml  Output   2000 ml  Net 174.76 ml     Diet Order: Cardiac  Supplements/Tube Feeding: none   IVF:    sodium chloride  Last Rate: 150 mL/hr at 03/25/12 1130  heparin Last Rate: 1,600 Units/hr (03/26/12 0138)  nitroGLYCERIN Last Rate: 5 mcg/min (03/24/12 2314)  DISCONTD: heparin Last Rate: Stopped (03/25/12 0854)    Estimated Nutritional Needs:   Kcal: 1900-2100 Protein: 60-70 gm  Fluid: 1.9- 2.1 L   Admitted with chest pain, NSTEMI. Cardiac cath on 9/30 found three vessel coronary artery diease. Planned for CABG on 10/2.  Spoke with pt about weight loss, states he has been doing more physical work and been to busy to eat. Sometimes skipping meals. Has lost about 30 lbs over the past year. Not significant weight loss. Pt also reports a very good appetite, ate 100% of breakfast. Denies needs at this time. Encouraged to ask for snacks from RN is hungry between meals.   NUTRITION DIAGNOSIS: Unintentional weight loss  RELATED TO: increased work activity  AS EVIDENCE BY: skipping meals   MONITORING/EVALUATION(Goals): Goal: PO intake to remain >75% of most meals  Monitor: PO intake, weight, labs  EDUCATION NEEDS: -No education needs identified at this time   Clarene Duke RD, LDN Pager [phone] After Hours pager [phone]  03/26/2012, 9:58 AM

## 2012-03-26 NOTE — Progress Notes (Signed)
PFT completed. Unconfirmed results placed in Shadow Chart progress notes.

## 2012-03-26 NOTE — Progress Notes (Signed)
ANTICOAGULATION CONSULT NOTE - Follow Up Consult  Pharmacy Consult for heparin Indication: CAD awaiting CABG  Labs:  Basename 03/26/12 0952 03/26/12 0030 03/26/12 0003 03/25/12 1317 03/25/12 0435 03/24/12 2314  HGB 15.2 14.1 -- -- -- --  HCT 44.8 42.8 -- -- 43.5 --  PLT 155 207 -- -- 207 --  APTT -- -- -- -- -- 71*  LABPROT -- -- -- -- -- --  INR -- -- -- -- -- --  HEPARINUNFRC 0.41 -- 0.16* -- 0.15* --  CREATININE -- -- -- -- 0.84 --  CKTOTAL -- -- -- -- -- --  CKMB -- -- -- -- -- --  TROPONINI -- -- -- 3.24* 5.33* 3.96*    Assessment: 56yo male therapeutic on heparin after resumed post-cath, now awaiting CABG.  Goal of Therapy:  Heparin level 0.3-0.5 units/ml   Plan: 1) Continue heparin at 1600 units / hr 2) Follow up after surgery tomorrow  Thank you. Okey Regal [phone]  03/26/2012,2:06 PM

## 2012-03-26 NOTE — Progress Notes (Signed)
Patient ID: Shawn Carey, male   DOB: 07/22/1955, 56 y.o.   MRN: [id number] TCTS [place] PROGRESS NOTE                   [address]            [place] [address]          [phone]      1 Day Post-Op Procedure(s) (LRB): LEFT HEART CATHETERIZATION WITH CORONARY ANGIOGRAM (N/A)  Total Length of Stay:  LOS: 2 days   Subjective: No chest pain today  Objective: Vital signs in last 24 hours: Temp:  [97.5 F (36.4 C)-98.5 F (36.9 C)] 97.9 F (36.6 C) (10/01 1500) Pulse Rate:  [50-100] 88  (10/01 1500) Cardiac Rhythm:  [-] Normal sinus rhythm (10/01 0941) Resp:  [16-18] 18  (10/01 1500) BP: (117-150)/(73-85) 129/74 mmHg (10/01 1500) SpO2:  [96 %-98 %] 96 % (10/01 1500)  Filed Weights   03/24/12 2301  Weight: 159 lb 6.3 oz (72.3 kg)    Weight change:    Hemodynamic parameters for last 24 hours:    Intake/Output from previous day: 09/30 0701 - 10/01 0700 In: 1806.8 [P.O.:680; I.V.:1126.8] Out: 2250 [Urine:2250]  Intake/Output this shift: Total I/O In: 392 [P.O.:360; I.V.:32] Out: -   Current Meds: Scheduled Meds:   . albuterol  2.5 mg Nebulization Once  . antiseptic oral rinse  15 mL Mouth Rinse BID  . aspirin EC  81 mg Oral [place]  . atorvastatin  80 mg Oral q1800  . pneumococcal 23 valent vaccine  0.5 mL Intramuscular Tomorrow-1000   Continuous Infusions:   . sodium chloride 150 mL/hr at 03/25/12 1130  . heparin 1,600 Units/hr (03/26/12 0138)  . nitroGLYCERIN 5 mcg/min (03/24/12 2314)   PRN Meds:.acetaminophen, diphenhydrAMINE, ondansetron (ZOFRAN) IV  General appearance: alert, cooperative and appears stated age Neurologic: intact Heart: regular rate and rhythm, S1, S2 normal, no murmur, click, rub or gallop and normal apical impulse Lungs: clear to auscultation bilaterally and normal percussion bilaterally Abdomen: soft, non-tender; bowel sounds normal; no masses,  no organomegaly Extremities: extremities normal, atraumatic, no  cyanosis or edema and Homans sign is negative, no sign of DVT  Lab Results: CBC: Basename 03/26/12 0952 03/26/12 0030  WBC 6.9 10.5  HGB 15.2 14.1  HCT 44.8 42.8  PLT 155 207   BMET:  Basename 03/25/12 0435  NA 138  K 3.6  CL 103  CO2 27  GLUCOSE 93  BUN 12  CREATININE 0.84  CALCIUM 9.1    PT/INR: No results found for this basename: LABPROT,INR in the last 72 hours Radiology: No results found.  PFT" fev1 2.25 52 % DLCO  50 %     Assessment/Plan: S/P Procedure(s) (LRB): LEFT HEART CATHETERIZATION WITH CORONARY ANGIOGRAM (N/A) plan to proceed with CABG tomorrow  The goals risks and alternatives of the planned surgical procedure CABG  have been discussed with the patient in detail. The risks of the procedure including death, infection, stroke, myocardial infarction, bleeding, blood transfusion have all been discussed specifically.  I have quoted Shawn Carey a 3 % of perioperative mortality and a complication rate as high as 20 %. The patient's questions have been answered.Shawn Carey is willing  to proceed with the planned procedure.   Delight Ovens MD  Beeper [phone] Office [phone] 03/26/2012 5:18 PM

## 2012-03-27 ENCOUNTER — Encounter (HOSPITAL_COMMUNITY)
Admission: EM | Disposition: A | Discharge status: Home / Self Care | Payer: Self-pay | Source: Other Acute Inpatient Hospital | Attending: Cardiothoracic Surgery

## 2012-03-27 ENCOUNTER — Encounter (HOSPITAL_COMMUNITY): Payer: Self-pay | Admitting: Anesthesiology

## 2012-03-27 ENCOUNTER — Inpatient Hospital Stay (HOSPITAL_COMMUNITY): Payer: 59

## 2012-03-27 ENCOUNTER — Inpatient Hospital Stay (HOSPITAL_COMMUNITY): Payer: 59 | Admitting: Anesthesiology

## 2012-03-27 DIAGNOSIS — I2510 Atherosclerotic heart disease of native coronary artery without angina pectoris: Secondary | ICD-10-CM

## 2012-03-27 HISTORY — PX: CORONARY ARTERY BYPASS GRAFT: SHX141

## 2012-03-27 LAB — POCT I-STAT 4, (NA,K, GLUC, HGB,HCT)
Glucose, Bld: 86 mg/dL (ref 70–99)
Glucose, Bld: 112 mg/dL — ABNORMAL HIGH (ref 70–99)
Glucose, Bld: 89 mg/dL (ref 70–99)
HCT: 34 % — ABNORMAL LOW (ref 39.0–52.0)
HCT: 35 % — ABNORMAL LOW (ref 39.0–52.0)
Hemoglobin: 11.6 g/dL — ABNORMAL LOW (ref 13.0–17.0)
Hemoglobin: 10.2 g/dL — ABNORMAL LOW (ref 13.0–17.0)
Hemoglobin: 11.9 g/dL — ABNORMAL LOW (ref 13.0–17.0)
Potassium: 4.2 mEq/L (ref 3.5–5.1)
Potassium: 5 mEq/L (ref 3.5–5.1)
Potassium: 4.6 mEq/L (ref 3.5–5.1)
Potassium: 3.6 mEq/L (ref 3.5–5.1)
Sodium: 138 mEq/L (ref 135–145)
Sodium: 134 mEq/L — ABNORMAL LOW (ref 135–145)

## 2012-03-27 LAB — POCT I-STAT 3, ART BLOOD GAS (G3+)
Acid-base deficit: 3 mmol/L — ABNORMAL HIGH (ref 0.0–2.0)
Acid-base deficit: 1 mmol/L (ref 0.0–2.0)
Acid-base deficit: 7 mmol/L — ABNORMAL HIGH (ref 0.0–2.0)
Acid-base deficit: 3 mmol/L — ABNORMAL HIGH (ref 0.0–2.0)
Bicarbonate: 24.2 mEq/L — ABNORMAL HIGH (ref 20.0–24.0)
Bicarbonate: 22.4 mEq/L (ref 20.0–24.0)
Bicarbonate: 18.5 mEq/L — ABNORMAL LOW (ref 20.0–24.0)
O2 Saturation: 100 %
O2 Saturation: 96 %
O2 Saturation: 98 %
O2 Saturation: 97 %
Patient temperature: 32
Patient temperature: 37
Patient temperature: 38.1
Patient temperature: 38.1
TCO2: 25 mmol/L (ref 0–100)
TCO2: 24 mmol/L (ref 0–100)
TCO2: 25 mmol/L (ref 0–100)
pCO2 arterial: 36.9 mmHg (ref 35.0–45.0)
pCO2 arterial: 36.5 mmHg (ref 35.0–45.0)
pH, Arterial: 7.584 — ABNORMAL HIGH (ref 7.350–7.450)
pH, Arterial: 7.319 — ABNORMAL LOW (ref 7.350–7.450)
pO2, Arterial: 71 mmHg — ABNORMAL LOW (ref 80.0–100.0)
pO2, Arterial: 120 mmHg — ABNORMAL HIGH (ref 80.0–100.0)

## 2012-03-27 LAB — POCT I-STAT, CHEM 8
BUN: 12 mg/dL (ref 6–23)
Calcium, Ion: 1.13 mmol/L (ref 1.12–1.23)
Glucose, Bld: 132 mg/dL — ABNORMAL HIGH (ref 70–99)
HCT: 40 % (ref 39.0–52.0)
TCO2: 19 mmol/L (ref 0–100)

## 2012-03-27 LAB — POCT I-STAT 3, VENOUS BLOOD GAS (G3P V)
Bicarbonate: 24 mEq/L (ref 20.0–24.0)
O2 Saturation: 80 %
TCO2: 25 mmol/L (ref 0–100)
pCO2, Ven: 29.7 mmHg — ABNORMAL LOW (ref 45.0–50.0)
pH, Ven: 7.495 — ABNORMAL HIGH (ref 7.250–7.300)
pO2, Ven: 31 mmHg (ref 30.0–45.0)

## 2012-03-27 LAB — CBC
HCT: 43.1 % (ref 39.0–52.0)
HCT: 36 % — ABNORMAL LOW (ref 39.0–52.0)
HCT: 39.6 % (ref 39.0–52.0)
Hemoglobin: 14.4 g/dL (ref 13.0–17.0)
Hemoglobin: 12.2 g/dL — ABNORMAL LOW (ref 13.0–17.0)
Hemoglobin: 13.6 g/dL (ref 13.0–17.0)
MCH: 29.8 pg (ref 26.0–34.0)
MCH: 30.4 pg (ref 26.0–34.0)
MCHC: 33.4 g/dL (ref 30.0–36.0)
MCHC: 33.9 g/dL (ref 30.0–36.0)
MCHC: 34.3 g/dL (ref 30.0–36.0)
MCV: 88.6 fL (ref 78.0–100.0)
Platelets: 137 10*3/uL — ABNORMAL LOW (ref 150–400)
RBC: 4.47 MIL/uL (ref 4.22–5.81)
RDW: 13.3 % (ref 11.5–15.5)
RDW: 13.4 % (ref 11.5–15.5)
WBC: 15.9 10*3/uL — ABNORMAL HIGH (ref 4.0–10.5)

## 2012-03-27 LAB — BASIC METABOLIC PANEL
BUN: 13 mg/dL (ref 6–23)
CO2: 29 mEq/L (ref 19–32)
Calcium: 9.4 mg/dL (ref 8.4–10.5)
Chloride: 103 mEq/L (ref 96–112)
Creatinine, Ser: 1.02 mg/dL (ref 0.50–1.35)
GFR calc Af Amer: >90 mL/min (ref >90)
GFR calc non Af Amer: 81 mL/min — ABNORMAL LOW (ref >90)
Glucose, Bld: 96 mg/dL (ref 70–99)
Potassium: 3.7 mEq/L (ref 3.5–5.1)
Sodium: 139 mEq/L (ref 135–145)

## 2012-03-27 LAB — GLUCOSE, CAPILLARY: Glucose-Capillary: 114 mg/dL — ABNORMAL HIGH (ref 70–99)

## 2012-03-27 LAB — HEMOGLOBIN A1C
Hgb A1c MFr Bld: 5.5 % (ref <5.7)
Mean Plasma Glucose: 111 mg/dL (ref <117)

## 2012-03-27 LAB — SURGICAL PCR SCREEN
MRSA, PCR: NEGATIVE
Staphylococcus aureus: NEGATIVE

## 2012-03-27 LAB — HEMOGLOBIN AND HEMATOCRIT, BLOOD
HCT: 30.5 % — ABNORMAL LOW (ref 39.0–52.0)
Hemoglobin: 10.5 g/dL — ABNORMAL LOW (ref 13.0–17.0)

## 2012-03-27 LAB — PLATELET COUNT: Platelets: 140 10*3/uL — ABNORMAL LOW (ref 150–400)

## 2012-03-27 LAB — MAGNESIUM: Magnesium: 2.8 mg/dL — ABNORMAL HIGH (ref 1.5–2.5)

## 2012-03-27 LAB — PROTIME-INR: INR: 1.33 (ref 0.00–1.49)

## 2012-03-27 LAB — CREATININE, SERUM
Creatinine, Ser: 0.8 mg/dL (ref 0.50–1.35)
GFR calc Af Amer: >90 mL/min (ref >90)
GFR calc non Af Amer: >90 mL/min (ref >90)

## 2012-03-27 SURGERY — CORONARY ARTERY BYPASS GRAFTING (CABG)
Anesthesia: General | Site: Chest | Wound class: Clean

## 2012-03-27 MED ORDER — PROTAMINE SULFATE 10 MG/ML IV SOLN
INTRAVENOUS | Status: DC | PRN
  Administered 2012-03-27: 240 mg via INTRAVENOUS

## 2012-03-27 MED ORDER — MORPHINE SULFATE 2 MG/ML IJ SOLN
1.0000 mg | INTRAMUSCULAR | Status: AC | PRN
  Administered 2012-03-27: 4 mg via INTRAVENOUS

## 2012-03-27 MED ORDER — ASPIRIN 81 MG PO CHEW: 324.0000 mg | CHEWABLE_TABLET | Freq: Every day | ORAL | Status: DC

## 2012-03-27 MED ORDER — LACTATED RINGERS IV SOLN
INTRAVENOUS | Status: DC | PRN
  Administered 2012-03-27 (×3): via INTRAVENOUS

## 2012-03-27 MED ORDER — POTASSIUM CHLORIDE 10 MEQ/50ML IV SOLN
10.0000 meq | INTRAVENOUS | Status: AC
  Administered 2012-03-27 (×3): 10 meq via INTRAVENOUS

## 2012-03-27 MED ORDER — BISACODYL 5 MG PO TBEC
10.0000 mg | DELAYED_RELEASE_TABLET | Freq: Every day | ORAL | Status: DC
  Administered 2012-03-28: 10 mg via ORAL
  Filled 2012-03-27: qty 2

## 2012-03-27 MED ORDER — HEMOSTATIC AGENTS (NO CHARGE) OPTIME
TOPICAL | Status: DC | PRN
  Administered 2012-03-27: 1 via TOPICAL

## 2012-03-27 MED ORDER — MIDAZOLAM HCL 5 MG/5ML IJ SOLN
INTRAMUSCULAR | Status: DC | PRN
  Administered 2012-03-27: 3 mg via INTRAVENOUS
  Administered 2012-03-27: 2 mg via INTRAVENOUS
  Administered 2012-03-27: 3 mg via INTRAVENOUS
  Administered 2012-03-27: 2 mg via INTRAVENOUS
  Administered 2012-03-27: 4 mg via INTRAVENOUS

## 2012-03-27 MED ORDER — NITROGLYCERIN IN D5W 200-5 MCG/ML-% IV SOLN
INTRAVENOUS | Status: DC | PRN
  Administered 2012-03-27: 5 ug/min via INTRAVENOUS

## 2012-03-27 MED ORDER — SODIUM CHLORIDE 0.9 % IV SOLN
INTRAVENOUS | Status: DC
  Filled 2012-03-27: qty 1

## 2012-03-27 MED ORDER — ACETAMINOPHEN 500 MG PO TABS
1000.0000 mg | ORAL_TABLET | Freq: Four times a day (QID) | ORAL | Status: DC
  Administered 2012-03-28 – 2012-03-29 (×6): 1000 mg via ORAL
  Filled 2012-03-27 (×10): qty 2

## 2012-03-27 MED ORDER — SODIUM CHLORIDE 0.9 % IJ SOLN
3.0000 mL | Freq: Two times a day (BID) | INTRAMUSCULAR | Status: DC
  Administered 2012-03-28: 22:00:00 via INTRAVENOUS

## 2012-03-27 MED ORDER — PANTOPRAZOLE SODIUM 40 MG PO TBEC: 40.0000 mg | DELAYED_RELEASE_TABLET | Freq: Every day | ORAL | Status: DC

## 2012-03-27 MED ORDER — FENTANYL CITRATE 0.05 MG/ML IJ SOLN
INTRAMUSCULAR | Status: DC | PRN
  Administered 2012-03-27 (×2): 100 ug via INTRAVENOUS
  Administered 2012-03-27: 50 ug via INTRAVENOUS
  Administered 2012-03-27: 150 ug via INTRAVENOUS
  Administered 2012-03-27: 250 ug via INTRAVENOUS
  Administered 2012-03-27 (×2): 100 ug via INTRAVENOUS
  Administered 2012-03-27: 200 ug via INTRAVENOUS
  Administered 2012-03-27: 50 ug via INTRAVENOUS
  Administered 2012-03-27: 100 ug via INTRAVENOUS
  Administered 2012-03-27: 50 ug via INTRAVENOUS
  Administered 2012-03-27 (×2): 150 ug via INTRAVENOUS
  Administered 2012-03-27: 100 ug via INTRAVENOUS
  Administered 2012-03-27 (×2): 250 ug via INTRAVENOUS
  Administered 2012-03-27: 100 ug via INTRAVENOUS

## 2012-03-27 MED ORDER — ACETAMINOPHEN 160 MG/5ML PO SOLN: 650.0000 mg | ORAL | Status: AC

## 2012-03-27 MED ORDER — PHENYLEPHRINE HCL 10 MG/ML IJ SOLN
0.0000 ug/min | INTRAVENOUS | Status: DC
  Filled 2012-03-27: qty 2

## 2012-03-27 MED ORDER — ACETAMINOPHEN 650 MG RE SUPP
650.0000 mg | RECTAL | Status: AC
  Administered 2012-03-27: 650 mg via RECTAL

## 2012-03-27 MED ORDER — METOPROLOL TARTRATE 25 MG/10 ML ORAL SUSPENSION
12.5000 mg | Freq: Two times a day (BID) | ORAL | Status: DC
  Filled 2012-03-27 (×5): qty 5

## 2012-03-27 MED ORDER — DEXMEDETOMIDINE HCL IN NACL 200 MCG/50ML IV SOLN
0.1000 ug/kg/h | INTRAVENOUS | Status: DC
  Administered 2012-03-27: 0.2 ug/kg/h via INTRAVENOUS
  Filled 2012-03-27: qty 50

## 2012-03-27 MED ORDER — SODIUM CHLORIDE 0.9 % IV SOLN: INTRAVENOUS | Status: DC

## 2012-03-27 MED ORDER — DEXMEDETOMIDINE HCL IN NACL 200 MCG/50ML IV SOLN
INTRAVENOUS | Status: DC | PRN
  Administered 2012-03-27: 0.2 ug/kg/h via INTRAVENOUS

## 2012-03-27 MED ORDER — SODIUM CHLORIDE 0.9 % IJ SOLN: 3.0000 mL | INTRAMUSCULAR | Status: DC | PRN

## 2012-03-27 MED ORDER — ACETAMINOPHEN 160 MG/5ML PO SOLN: 975.0000 mg | Freq: Four times a day (QID) | ORAL | Status: DC

## 2012-03-27 MED ORDER — DEXTROSE 5 % IV SOLN
1.5000 g | Freq: Two times a day (BID) | INTRAVENOUS | Status: DC
  Administered 2012-03-27 – 2012-03-28 (×3): 1.5 g via INTRAVENOUS
  Filled 2012-03-27 (×4): qty 1.5

## 2012-03-27 MED ORDER — LACTATED RINGERS IV SOLN: 500.0000 mL | Freq: Once | INTRAVENOUS | Status: AC | PRN

## 2012-03-27 MED ORDER — HEPARIN SODIUM (PORCINE) 1000 UNIT/ML IJ SOLN
INTRAMUSCULAR | Status: DC | PRN
  Administered 2012-03-27: 30000 [IU] via INTRAVENOUS

## 2012-03-27 MED ORDER — VANCOMYCIN HCL IN DEXTROSE 1-5 GM/200ML-% IV SOLN
1000.0000 mg | Freq: Once | INTRAVENOUS | Status: AC
  Administered 2012-03-27: 1000 mg via INTRAVENOUS
  Filled 2012-03-27 (×2): qty 200

## 2012-03-27 MED ORDER — INSULIN ASPART 100 UNIT/ML ~~LOC~~ SOLN
0.0000 [IU] | SUBCUTANEOUS | Status: AC
  Administered 2012-03-27: 2 [IU] via SUBCUTANEOUS

## 2012-03-27 MED ORDER — MIDAZOLAM HCL 2 MG/2ML IJ SOLN: 2.0000 mg | INTRAMUSCULAR | Status: DC | PRN

## 2012-03-27 MED ORDER — VECURONIUM BROMIDE 10 MG IV SOLR
INTRAVENOUS | Status: DC | PRN
  Administered 2012-03-27 (×2): 2.5 mg via INTRAVENOUS
  Administered 2012-03-27 (×4): 5 mg via INTRAVENOUS

## 2012-03-27 MED ORDER — OXYCODONE HCL 5 MG PO TABS
5.0000 mg | ORAL_TABLET | ORAL | Status: DC | PRN
  Administered 2012-03-28 – 2012-03-29 (×7): 10 mg via ORAL
  Filled 2012-03-27 (×7): qty 2

## 2012-03-27 MED ORDER — NITROGLYCERIN IN D5W 200-5 MCG/ML-% IV SOLN: 0.0000 ug/min | INTRAVENOUS | Status: DC

## 2012-03-27 MED ORDER — ROCURONIUM BROMIDE 100 MG/10ML IV SOLN
INTRAVENOUS | Status: DC | PRN
  Administered 2012-03-27: 50 mg via INTRAVENOUS

## 2012-03-27 MED ORDER — ONDANSETRON HCL 4 MG/2ML IJ SOLN
4.0000 mg | Freq: Four times a day (QID) | INTRAMUSCULAR | Status: DC | PRN
  Filled 2012-03-27: qty 2

## 2012-03-27 MED ORDER — SODIUM CHLORIDE 0.9 % IV SOLN
10.0000 mg | INTRAVENOUS | Status: DC | PRN
  Administered 2012-03-27: 10 ug/min via INTRAVENOUS

## 2012-03-27 MED ORDER — ASPIRIN EC 325 MG PO TBEC
325.0000 mg | DELAYED_RELEASE_TABLET | Freq: Every day | ORAL | Status: DC
  Administered 2012-03-28: 325 mg via ORAL
  Filled 2012-03-27 (×2): qty 1

## 2012-03-27 MED ORDER — PROPOFOL 10 MG/ML IV BOLUS
INTRAVENOUS | Status: DC | PRN
  Administered 2012-03-27: 90 mg via INTRAVENOUS

## 2012-03-27 MED ORDER — METOPROLOL TARTRATE 12.5 MG HALF TABLET
12.5000 mg | ORAL_TABLET | Freq: Two times a day (BID) | ORAL | Status: DC
  Administered 2012-03-28 (×2): 12.5 mg via ORAL
  Filled 2012-03-27 (×5): qty 1

## 2012-03-27 MED ORDER — ALBUMIN HUMAN 5 % IV SOLN: 250.0000 mL | INTRAVENOUS | Status: AC | PRN

## 2012-03-27 MED ORDER — ALBUMIN HUMAN 5 % IV SOLN
INTRAVENOUS | Status: DC | PRN
  Administered 2012-03-27: 14:00:00 via INTRAVENOUS

## 2012-03-27 MED ORDER — MAGNESIUM SULFATE 40 MG/ML IJ SOLN
4.0000 g | Freq: Once | INTRAMUSCULAR | Status: AC
  Administered 2012-03-27: 4 g via INTRAVENOUS
  Filled 2012-03-27: qty 100

## 2012-03-27 MED ORDER — LEVALBUTEROL HCL 0.63 MG/3ML IN NEBU
0.6300 mg | INHALATION_SOLUTION | Freq: Three times a day (TID) | RESPIRATORY_TRACT | Status: DC
  Administered 2012-03-27 – 2012-03-28 (×4): 0.63 mg via RESPIRATORY_TRACT
  Filled 2012-03-27 (×8): qty 3

## 2012-03-27 MED ORDER — INSULIN ASPART 100 UNIT/ML ~~LOC~~ SOLN
0.0000 [IU] | SUBCUTANEOUS | Status: DC
  Administered 2012-03-28: 2 [IU] via SUBCUTANEOUS

## 2012-03-27 MED ORDER — SODIUM CHLORIDE 0.45 % IV SOLN: INTRAVENOUS | Status: DC

## 2012-03-27 MED ORDER — AMINOCAPROIC ACID 250 MG/ML IV SOLN
INTRAVENOUS | Status: DC | PRN
  Administered 2012-03-27: 5 g via INTRAVENOUS

## 2012-03-27 MED ORDER — METOPROLOL TARTRATE 1 MG/ML IV SOLN: 2.5000 mg | INTRAVENOUS | Status: DC | PRN

## 2012-03-27 MED ORDER — INSULIN REGULAR BOLUS VIA INFUSION
0.0000 [IU] | Freq: Three times a day (TID) | INTRAVENOUS | Status: DC
  Filled 2012-03-27: qty 10

## 2012-03-27 MED ORDER — 0.9 % SODIUM CHLORIDE (POUR BTL) OPTIME
TOPICAL | Status: DC | PRN
  Administered 2012-03-27: 5000 mL

## 2012-03-27 MED ORDER — DOCUSATE SODIUM 100 MG PO CAPS
200.0000 mg | ORAL_CAPSULE | Freq: Every day | ORAL | Status: DC
  Administered 2012-03-28: 200 mg via ORAL
  Filled 2012-03-27: qty 2

## 2012-03-27 MED ORDER — SODIUM CHLORIDE 0.9 % IJ SOLN
OROMUCOSAL | Status: DC | PRN
  Administered 2012-03-27: 11:00:00 via TOPICAL

## 2012-03-27 MED ORDER — FAMOTIDINE IN NACL 20-0.9 MG/50ML-% IV SOLN
20.0000 mg | Freq: Two times a day (BID) | INTRAVENOUS | Status: DC
  Administered 2012-03-27: 20 mg via INTRAVENOUS

## 2012-03-27 MED ORDER — MORPHINE SULFATE 2 MG/ML IJ SOLN
2.0000 mg | INTRAMUSCULAR | Status: DC | PRN
  Administered 2012-03-27 – 2012-03-28 (×3): 2 mg via INTRAVENOUS
  Administered 2012-03-28: 4 mg via INTRAVENOUS
  Administered 2012-03-28 (×2): 2 mg via INTRAVENOUS
  Filled 2012-03-27: qty 1
  Filled 2012-03-27: qty 2
  Filled 2012-03-27 (×2): qty 1
  Filled 2012-03-27: qty 2
  Filled 2012-03-27 (×2): qty 1

## 2012-03-27 MED ORDER — LACTATED RINGERS IV SOLN: INTRAVENOUS | Status: DC

## 2012-03-27 MED ORDER — SODIUM CHLORIDE 0.9 % IV SOLN
INTRAVENOUS | Status: DC | PRN
  Administered 2012-03-27: 09:00:00 via INTRAVENOUS

## 2012-03-27 MED ORDER — BISACODYL 10 MG RE SUPP: 10.0000 mg | Freq: Every day | RECTAL | Status: DC

## 2012-03-27 MED ORDER — SODIUM CHLORIDE 0.9 % IV SOLN: 250.0000 mL | INTRAVENOUS | Status: DC

## 2012-03-27 SURGICAL SUPPLY — 112 items
ATTRACTOMAT 16X20 MAGNETIC DRP (DRAPES) ×2 IMPLANT
BAG DECANTER FOR FLEXI CONT (MISCELLANEOUS) ×2 IMPLANT
BANDAGE ELASTIC 4 VELCRO ST LF (GAUZE/BANDAGES/DRESSINGS) ×4 IMPLANT
BANDAGE ELASTIC 6 VELCRO ST LF (GAUZE/BANDAGES/DRESSINGS) ×4 IMPLANT
BANDAGE GAUZE ELAST BULKY 4 IN (GAUZE/BANDAGES/DRESSINGS) ×4 IMPLANT
BENZOIN TINCTURE PRP APPL 2/3 (GAUZE/BANDAGES/DRESSINGS) ×2 IMPLANT
BLADE STERNUM SYSTEM 6 (BLADE) ×2 IMPLANT
BLADE SURG 11 STRL SS (BLADE) ×2 IMPLANT
BLADE SURG ROTATE 9660 (MISCELLANEOUS) IMPLANT
CABLE PACING FASLOC BIEGE (MISCELLANEOUS) ×2 IMPLANT
CANISTER SUCTION 2500CC (MISCELLANEOUS) ×2 IMPLANT
CANN PRFSN .5XCNCT 15X34-48 (MISCELLANEOUS) ×1
CANNULA AORTIC HI-FLOW 6.5M20F (CANNULA) ×2 IMPLANT
CANNULA PRFSN .5XCNCT 15X34-48 (MISCELLANEOUS) ×1 IMPLANT
CANNULA VEN 2 STAGE (MISCELLANEOUS) ×1
CATH CPB KIT GERHARDT (MISCELLANEOUS) ×2 IMPLANT
CATH THORACIC 28FR (CATHETERS) ×2 IMPLANT
CATH THORACIC 36FR (CATHETERS) ×2 IMPLANT
CATH THORACIC 36FR RT ANG (CATHETERS) IMPLANT
CLIP TI MEDIUM 24 (CLIP) IMPLANT
CLIP TI WIDE RED SMALL 24 (CLIP) IMPLANT
CLOTH BEACON ORANGE TIMEOUT ST (SAFETY) ×2 IMPLANT
CLSR STERI-STRIP ANTIMIC 1/2X4 (GAUZE/BANDAGES/DRESSINGS) ×2 IMPLANT
COVER SURGICAL LIGHT HANDLE (MISCELLANEOUS) ×4 IMPLANT
CRADLE DONUT ADULT HEAD (MISCELLANEOUS) ×2 IMPLANT
DRAIN CHANNEL 28F RND 3/8 FF (WOUND CARE) IMPLANT
DRAPE CARDIOVASCULAR INCISE (DRAPES) ×1
DRAPE SLUSH/WARMER DISC (DRAPES) ×2 IMPLANT
DRAPE SRG 135X102X78XABS (DRAPES) ×1 IMPLANT
DRSG COVADERM 4X14 (GAUZE/BANDAGES/DRESSINGS) ×4 IMPLANT
ELECT BLADE 4.0 EZ CLEAN MEGAD (MISCELLANEOUS) ×2
ELECT CAUTERY BLADE 6.4 (BLADE) ×4 IMPLANT
ELECT REM PT RETURN 9FT ADLT (ELECTROSURGICAL) ×4
ELECTRODE BLDE 4.0 EZ CLN MEGD (MISCELLANEOUS) ×1 IMPLANT
ELECTRODE REM PT RTRN 9FT ADLT (ELECTROSURGICAL) ×2 IMPLANT
GLOVE BIO SURGEON STRL SZ 6 (GLOVE) ×4 IMPLANT
GLOVE BIO SURGEON STRL SZ 6.5 (GLOVE) ×6 IMPLANT
GLOVE BIO SURGEON STRL SZ7 (GLOVE) IMPLANT
GLOVE BIO SURGEON STRL SZ7.5 (GLOVE) IMPLANT
GLOVE BIOGEL PI IND STRL 6 (GLOVE) ×2 IMPLANT
GLOVE BIOGEL PI IND STRL 6.5 (GLOVE) ×3 IMPLANT
GLOVE BIOGEL PI IND STRL 7.0 (GLOVE) ×1 IMPLANT
GLOVE BIOGEL PI INDICATOR 6 (GLOVE) ×2
GLOVE BIOGEL PI INDICATOR 6.5 (GLOVE) ×3
GLOVE BIOGEL PI INDICATOR 7.0 (GLOVE) ×1
GOWN STRL NON-REIN LRG LVL3 (GOWN DISPOSABLE) ×14 IMPLANT
HEMOSTAT POWDER SURGIFOAM 1G (HEMOSTASIS) ×6 IMPLANT
HEMOSTAT SURGICEL 2X14 (HEMOSTASIS) ×2 IMPLANT
INSERT FOGARTY 61MM (MISCELLANEOUS) IMPLANT
INSERT FOGARTY XLG (MISCELLANEOUS) IMPLANT
KIT BASIN OR (CUSTOM PROCEDURE TRAY) ×2 IMPLANT
KIT ROOM TURNOVER OR (KITS) ×2 IMPLANT
KIT SUCTION CATH 14FR (SUCTIONS) ×2 IMPLANT
KIT VASOVIEW W/TROCAR VH 2000 (KITS) ×2 IMPLANT
LEAD PACING MYOCARDI (MISCELLANEOUS) ×2 IMPLANT
MARKER GRAFT CORONARY BYPASS (MISCELLANEOUS) ×12 IMPLANT
NS IRRIG 1000ML POUR BTL (IV SOLUTION) ×10 IMPLANT
PACK OPEN HEART (CUSTOM PROCEDURE TRAY) ×2 IMPLANT
PAD ARMBOARD 7.5X6 YLW CONV (MISCELLANEOUS) ×4 IMPLANT
PENCIL BUTTON HOLSTER BLD 10FT (ELECTRODE) ×2 IMPLANT
PUNCH AORTIC ROTATE 4.0MM (MISCELLANEOUS) IMPLANT
PUNCH AORTIC ROTATE 4.5MM 8IN (MISCELLANEOUS) ×2 IMPLANT
PUNCH AORTIC ROTATE 5MM 8IN (MISCELLANEOUS) IMPLANT
SET CARDIOPLEGIA MPS 5001102 (MISCELLANEOUS) ×2 IMPLANT
SOLUTION ANTI FOG 6CC (MISCELLANEOUS) IMPLANT
SPONGE GAUZE 4X4 12PLY (GAUZE/BANDAGES/DRESSINGS) ×2 IMPLANT
SPONGE LAP 18X18 X RAY DECT (DISPOSABLE) ×6 IMPLANT
SPONGE LAP 4X18 X RAY DECT (DISPOSABLE) IMPLANT
SURGIFLO W/THROMBIN 8M KIT (HEMOSTASIS) ×2 IMPLANT
SUT BONE WAX W31G (SUTURE) ×2 IMPLANT
SUT MNCRL AB 4-0 PS2 18 (SUTURE) IMPLANT
SUT PROLENE 3 0 SH DA (SUTURE) IMPLANT
SUT PROLENE 3 0 SH1 36 (SUTURE) ×2 IMPLANT
SUT PROLENE 4 0 RB 1 (SUTURE)
SUT PROLENE 4 0 SH DA (SUTURE) IMPLANT
SUT PROLENE 4 0 TF (SUTURE) ×4 IMPLANT
SUT PROLENE 4-0 RB1 .5 CRCL 36 (SUTURE) IMPLANT
SUT PROLENE 5 0 C 1 36 (SUTURE) ×4 IMPLANT
SUT PROLENE 6 0 C 1 30 (SUTURE) ×6 IMPLANT
SUT PROLENE 6 0 CC (SUTURE) ×8 IMPLANT
SUT PROLENE 7 0 BV 1 (SUTURE) IMPLANT
SUT PROLENE 7 0 BV1 MDA (SUTURE) ×6 IMPLANT
SUT PROLENE 7.0 RB 3 (SUTURE) IMPLANT
SUT PROLENE 8 0 BV175 6 (SUTURE) ×2 IMPLANT
SUT SILK  1 MH (SUTURE)
SUT SILK 1 MH (SUTURE) IMPLANT
SUT SILK 2 0 SH CR/8 (SUTURE) IMPLANT
SUT SILK 3 0 SH CR/8 (SUTURE) IMPLANT
SUT STEEL 6MS V (SUTURE) ×2 IMPLANT
SUT STEEL STERNAL CCS#1 18IN (SUTURE) IMPLANT
SUT STEEL SZ 6 DBL 3X14 BALL (SUTURE) ×2 IMPLANT
SUT VIC AB 1 CTX 18 (SUTURE) ×4 IMPLANT
SUT VIC AB 1 CTX 36 (SUTURE)
SUT VIC AB 1 CTX36XBRD ANBCTR (SUTURE) IMPLANT
SUT VIC AB 2-0 CT1 27 (SUTURE) ×1
SUT VIC AB 2-0 CT1 TAPERPNT 27 (SUTURE) ×1 IMPLANT
SUT VIC AB 2-0 CTX 27 (SUTURE) IMPLANT
SUT VIC AB 3-0 SH 27 (SUTURE)
SUT VIC AB 3-0 SH 27X BRD (SUTURE) IMPLANT
SUT VIC AB 3-0 X1 27 (SUTURE) ×4 IMPLANT
SUT VICRYL 4-0 PS2 18IN ABS (SUTURE) IMPLANT
SUTURE E-PAK OPEN HEART (SUTURE) ×2 IMPLANT
SYSTEM SAHARA CHEST DRAIN ATS (WOUND CARE) ×2 IMPLANT
TAPE CLOTH SURG 6X10 WHT LF (GAUZE/BANDAGES/DRESSINGS) ×2 IMPLANT
TOWEL OR 17X24 6PK STRL BLUE (TOWEL DISPOSABLE) ×4 IMPLANT
TOWEL OR 17X26 10 PK STRL BLUE (TOWEL DISPOSABLE) ×4 IMPLANT
TRAY FOLEY IC TEMP SENS 14FR (CATHETERS) ×2 IMPLANT
TUBE FEEDING 8FR 16IN STR KANG (MISCELLANEOUS) ×2 IMPLANT
TUBE SUCT INTRACARD DLP 20F (MISCELLANEOUS) ×2 IMPLANT
TUBING INSUFFLATION 10FT LAP (TUBING) ×2 IMPLANT
UNDERPAD 30X30 INCONTINENT (UNDERPADS AND DIAPERS) ×2 IMPLANT
WATER STERILE IRR 1000ML POUR (IV SOLUTION) ×4 IMPLANT

## 2012-03-27 NOTE — Anesthesia Preprocedure Evaluation (Signed)
Anesthesia Evaluation  Patient identified by MRN, date of birth, ID band Patient awake    Reviewed: Allergy & Precautions, H&P , NPO status , Patient's Chart, lab work & pertinent test results  History of Anesthesia Complications Negative for: history of anesthetic complications  Airway Mallampati: II  Neck ROM: Full    Dental  (+) Edentulous Upper   Pulmonary neg pulmonary ROS,  breath sounds clear to auscultation        Cardiovascular + CAD and + Past MI Rhythm:Regular Rate:Normal     Neuro/Psych    GI/Hepatic negative GI ROS, Neg liver ROS,   Endo/Other    Renal/GU negative Renal ROS     Musculoskeletal   Abdominal   Peds  Hematology negative hematology ROS (+)   Anesthesia Other Findings   Reproductive/Obstetrics                           Anesthesia Physical Anesthesia Plan  ASA: III  Anesthesia Plan: General   Post-op Pain Management:    Induction: Intravenous  Airway Management Planned: Oral ETT  Additional Equipment: Arterial line and PA Cath  Intra-op Plan: Utilization Of Total Body Hypothermia per surgeon request  Post-operative Plan: Post-operative intubation/ventilation  Informed Consent: I have reviewed the patients History and Physical, chart, labs and discussed the procedure including the risks, benefits and alternatives for the proposed anesthesia with the patient or authorized representative who has indicated his/her understanding and acceptance.   Dental advisory given  Plan Discussed with: CRNA and Surgeon  Anesthesia Plan Comments:         Anesthesia Quick Evaluation

## 2012-03-27 NOTE — OR Nursing (Signed)
1334 first call to SICU charge nurse.

## 2012-03-27 NOTE — OR Nursing (Signed)
1415 second call made to sicu

## 2012-03-27 NOTE — Brief Op Note (Addendum)
03/24/2012 - 03/27/2012  12:36 PM  PATIENT:  Shawn Carey  56 y.o. male  PRE-OPERATIVE DIAGNOSIS:  CAD  POST-OPERATIVE DIAGNOSIS:  CAD  PROCEDURE:    CORONARY ARTERY BYPASS GRAFTING x 7 (LIMA-D1, Seq SVG- bifurcated LAD x 2, SVG-OM1-OM2, SVG-PD-PL)  EVH Right leg  SURGEON:  Surgeon(s): Delight Ovens, MD  ASSISTANT: [name], PA-C  ANESTHESIA:   general  PATIENT CONDITION:  ICU - intubated and hemodynamically stable.  PRE-OPERATIVE WEIGHT: 72 kg

## 2012-03-27 NOTE — Procedures (Signed)
Extubation Procedure Note  Patient Details:   Name: Ladavion Giambalvo DOB: 01-21-1956 MRN: [id number]   Airway Documentation:     Evaluation  O2 sats: stable throughout and currently acceptable Complications: No apparent complications Patient did tolerate procedure well. Bilateral Breath Sounds: Clear   Yes Pt awake and alert. Extubated per protocol to 3L [place], sat 98%. Positive cuff leak, NIF-20, VC 850, IS 500, BBS RH. Pt able to vocalize. No complications noted.  Arloa Koh 03/27/2012, 6:50 PM

## 2012-03-27 NOTE — Progress Notes (Signed)
Patient ID: Shawn Carey, male   DOB: 09/16/1955, 56 y.o.   MRN: [id number]  SICU Evening Rounds:  Hemodynamically stable, CI= 2.3  Weaning on vent  CT output low Urine output good  CBC    Component Value Date/Time   WBC 13.2* 03/27/2012 1500   RBC 4.09* 03/27/2012 1500   HGB 12.2* 03/27/2012 1500   HCT 36.0* 03/27/2012 1500   PLT 128* 03/27/2012 1500   MCV 88.0 03/27/2012 1500   MCH 29.8 03/27/2012 1500   MCHC 33.9 03/27/2012 1500   RDW 13.3 03/27/2012 1500    BMET    Component Value Date/Time   NA 138 03/27/2012 1458   K 3.6 03/27/2012 1458   CL 103 03/27/2012 0350   CO2 29 03/27/2012 0350   GLUCOSE 89 03/27/2012 1458   BUN 13 03/27/2012 0350   CREATININE 1.02 03/27/2012 0350   CALCIUM 9.4 03/27/2012 0350   GFRNONAA 81* 03/27/2012 0350   GFRAA >90 03/27/2012 0350    Stable postop course

## 2012-03-27 NOTE — Progress Notes (Signed)
Transported to the operating room by RN.

## 2012-03-27 NOTE — Transfer of Care (Signed)
Immediate Anesthesia Transfer of Care Note  Patient: Shawn Carey  Procedure(s) Performed: Procedure(s) (LRB) with comments: CORONARY ARTERY BYPASS GRAFTING (CABG) (N/A) - Coronary Artery Bypass Grafting X 7 Using Left Internal Mammary Artery and Right Saphenous Leg Vein Harvested Endoscopically  Patient Location: ICU  Anesthesia Type: General  Level of Consciousness: sedated  Airway & Oxygen Therapy: Patient remains intubated per anesthesia plan and Patient placed on Ventilator (see vital sign flow sheet for setting)  Post-op Assessment: Report given to ICU RN and Post -op Vital signs reviewed and stable  Post vital signs: Reviewed and stable  Complications: No apparent anesthesia complications

## 2012-03-27 NOTE — Preoperative (Signed)
Beta Blockers   Reason not to administer Beta Blockers:Not Applicable, too lopressor this am

## 2012-03-28 ENCOUNTER — Inpatient Hospital Stay (HOSPITAL_COMMUNITY): Payer: 59

## 2012-03-28 ENCOUNTER — Encounter (HOSPITAL_COMMUNITY): Payer: Self-pay | Admitting: Cardiothoracic Surgery

## 2012-03-28 LAB — CBC
HCT: 37 % — ABNORMAL LOW (ref 39.0–52.0)
HCT: 38.8 % — ABNORMAL LOW (ref 39.0–52.0)
MCH: 30.2 pg (ref 26.0–34.0)
MCHC: 34.1 g/dL (ref 30.0–36.0)
MCV: 88.9 fL (ref 78.0–100.0)
MCV: 90 fL (ref 78.0–100.0)
Platelets: 139 10*3/uL — ABNORMAL LOW (ref 150–400)
Platelets: 157 10*3/uL (ref 150–400)
RBC: 4.31 MIL/uL (ref 4.22–5.81)
RDW: 13.5 % (ref 11.5–15.5)
WBC: 15.8 10*3/uL — ABNORMAL HIGH (ref 4.0–10.5)

## 2012-03-28 LAB — GLUCOSE, CAPILLARY
Glucose-Capillary: 116 mg/dL — ABNORMAL HIGH (ref 70–99)
Glucose-Capillary: 121 mg/dL — ABNORMAL HIGH (ref 70–99)
Glucose-Capillary: 130 mg/dL — ABNORMAL HIGH (ref 70–99)
Glucose-Capillary: 98 mg/dL (ref 70–99)

## 2012-03-28 LAB — MAGNESIUM: Magnesium: 2.3 mg/dL (ref 1.5–2.5)

## 2012-03-28 LAB — BASIC METABOLIC PANEL
BUN: 13 mg/dL (ref 6–23)
Creatinine, Ser: 0.84 mg/dL (ref 0.50–1.35)
GFR calc Af Amer: >90 mL/min (ref >90)
GFR calc non Af Amer: >90 mL/min (ref >90)

## 2012-03-28 LAB — CREATININE, SERUM: GFR calc Af Amer: >90 mL/min (ref >90)

## 2012-03-28 MED ORDER — ENOXAPARIN SODIUM 30 MG/0.3ML ~~LOC~~ SOLN
30.0000 mg | SUBCUTANEOUS | Status: DC
  Administered 2012-03-28: 30 mg via SUBCUTANEOUS
  Filled 2012-03-28 (×2): qty 0.3

## 2012-03-28 MED ORDER — MORPHINE SULFATE 4 MG/ML IJ SOLN
INTRAMUSCULAR | Status: AC
  Administered 2012-03-28: 4 mg
  Filled 2012-03-28: qty 1

## 2012-03-28 MED ORDER — INSULIN ASPART 100 UNIT/ML ~~LOC~~ SOLN
0.0000 [IU] | SUBCUTANEOUS | Status: DC
  Administered 2012-03-28: 2 [IU] via SUBCUTANEOUS

## 2012-03-28 MED FILL — Dexmedetomidine HCl IV Soln 200 MCG/2ML: INTRAVENOUS | Qty: 2 | Status: AC

## 2012-03-28 MED FILL — Magnesium Sulfate Inj 50%: INTRAMUSCULAR | Qty: 10 | Status: AC

## 2012-03-28 MED FILL — Potassium Chloride Inj 2 mEq/ML: INTRAVENOUS | Qty: 40 | Status: AC

## 2012-03-28 NOTE — Anesthesia Postprocedure Evaluation (Signed)
  Anesthesia Post-op Note  Patient: Shawn Carey  Procedure(s) Performed: Procedure(s) (LRB) with comments: CORONARY ARTERY BYPASS GRAFTING (CABG) (N/A) - Coronary Artery Bypass Grafting X 7 Using Left Internal Mammary Artery and Right Saphenous Leg Vein Harvested Endoscopically  Patient Location: SICU  Anesthesia Type: General  Level of Consciousness: awake, alert , oriented and patient cooperative  Airway and Oxygen Therapy: Patient Spontanous Breathing and Patient connected to nasal cannula oxygen  Post-op Pain: mild  Post-op Assessment: Post-op Vital signs reviewed, Patient's Cardiovascular Status Stable, Respiratory Function Stable, Patent Airway, No signs of Nausea or vomiting, Adequate PO intake and Pain level controlled  Post-op Vital Signs: Reviewed and stable  Complications: No apparent anesthesia complications

## 2012-03-28 NOTE — Progress Notes (Signed)
[facility] BRIEF SICU PROGRESS NOTE  1 Day Post-Op  S/P Procedure(s) (LRB): CORONARY ARTERY BYPASS GRAFTING (CABG) (N/A)   Stable day NSR BP stable O2 sats 97% on 2 L/min UOP adequate  Plan: Continue routine care  Malone Admire H 03/28/2012 5:51 PM

## 2012-03-28 NOTE — Op Note (Signed)
Shawn Carey, POEHLER               ACCOUNT NO.:  [id number]  MEDICAL RECORD NO.:  [id number]  LOCATION:  2303                         FACILITY:  [facility]  PHYSICIAN:  Shawn Plane, MD    DATE OF BIRTH:  14-Jul-1955  DATE OF PROCEDURE:  03/27/2012 DATE OF DISCHARGE:                              OPERATIVE REPORT   PREOPERATIVE DIAGNOSIS:  Three-vessel coronary occlusive disease with recent non-ST elevation myocardial infarction.  POSTOPERATIVE DIAGNOSIS:  Three-vessel coronary occlusive disease with recent non-ST elevation myocardial infarction.  SURGICAL PROCEDURE:  Coronary artery bypass grafting x7 with the left internal mammary to the dominant diagonal coronary artery, sequential reverse saphenous vein graft to the distal branches of the right fifth left anterior descending, sequential reverse saphenous vein graft to the first obtuse marginal and distal circumflex, sequential reverse saphenous vein graft to the posterior descending and posterolateral branches of the right coronary artery with right leg endo vein harvesting.  SURGEON:  Shawn Plane, MD  FIRST ASSISTANT:  [name], PA  BRIEF HISTORY:  The patient is a 56 year old male with a long-standing history of cigarette abuse, who presented to [facility] with progressive anginal symptoms over several months, and then with sudden persistent chest pain.  He was stabilized medically.  Troponins were elevated.  He was transferred to [facility] under the care of Dr. Dietrich Pates and underwent cardiac catheterization by Dr. Riley Kill, which demonstrated severe 3-vessel coronary artery disease and probably infarct vessel was the first obtuse marginal, which was subtotally occluded.  At the time of catheterization, the patient had a high-grade proximal LAD lesion and high-grade diagonal lesion.  The patient's anterior wall blood supply was by a large diagonal branch.  The LAD was wide dual LAD system with both small  and distal branches having disease at the bifurcation which is why the mammary artery was placed to the large diagonal artery and the sequential vein was carried to the 2 small distal LAD dual system.  The first obtuse marginal was subtotally occluded, filled left vein collaterals.  There was 70-80% circumflex disease.  The right coronary artery in the mid portion had 70-80% stenosis and a 70-80% stenosis between the PD and PL.  Overall ventricular function was preserved.  The patient did have limited pulmonary reserve with FEV1 50% of predicted, and DLCO 50% of predicted. Risks and options were discussed with the patient in detail.  Because of the severe three-vessel coronary artery disease, coronary artery bypass graft was recommended.  The patient agreed and signed informed consent.  DESCRIPTION OF PROCEDURE:  With Swan-Ganz and arterial line monitors in place, the patient underwent general endotracheal anesthesia without incident.  Skin of the chest and legs were prepped with Betadine, draped in usual sterile manner.  Using the [employer] endo vein harvesting system, a segment of vein was harvested from the right thigh and calf and was of excellent quality and caliber.  Median sternotomy was performed.  The left internal mammary artery was dissected down as a pedicle.  The distal artery was divided and hydrostatically dilated with saline wolf solution.  Pericardium was opened and the patient was systemically heparinized.  The ascending aorta was cannulated.  The right  atrium was cannulated and aortic root vent cardioplegia needle was introduced into the ascending aorta.  The patient was placed on cardiopulmonary bypass 2.4 L/minute/m2.  Sites of anastomosis were selected and dissected out the epicardium.  The patient's body temperature was cooled to 32 degrees.  Aortic crossclamp was applied. 500 mL of cold blood potassium cardioplegia was administered with diastolic arrest of the  heart.  Myocardial septal temperatures were monitored throughout the crossclamp.  Attention was turned first to the OM1 and distal circ. OM1 vessel was partially intramyocardial.  The vessel was opened and admitted a 1.5 mm probe.  Using a diamond type, side-to-side anastomosis was carried out.  Distal extent of the same vein was then carried with a smaller distal circumflex branch which was approximately 1.3 mm in size.  Using a running 7-0 Prolene, distal anastomosis was performed.  Attention was then turned to the PD and PL. With the poster descending coronary artery open, the proximal third of the vessel was somewhat thickened, but admitted a 1.5 mm probe.  A diamond-type side-to-side anastomosis was carried out with a running 7-0 Prolene.  Distal extent of the vein was then carried to the posterolateral branch of the right coronary artery, which is opened and admitted a 1-mm probe distally.  The vessel was approximately 1.3 mm in size.  Using a running 7-0 Prolene, distal anastomosis was performed. Attention was then turned to the anterior wall of the heart.  The proximal LAD was identified.  The lateral branch of the LAD system was identified.  The medial branch was intramyocardial.  A large diagonal branch was also opened.  Large diagonal branch was much larger than either of the LAD diagonal branches.  It dissected out the proximal portion of the intramyocardial branch of the LAD.  More distal was the very small size of the LAD branches.  It was decided to use the mammary artery to the large diagonal branch hence use of sequential vein to the 2 small branches of the widened LAD system.  The medial branch of the LAD system was opened and admitted a 1-mm probe distally.  Using a longitudinal side-to-side anastomosis was carried out with a second reverse saphenous vein.  The distal extent of this same vein was then carried short distance to the  more lateral branch of the LAD  system. This vessel was also small and admitted a 1 mm probe.  Using a running 7- 0 Prolene, distal anastomosis was performed.  Attention was then turned to the large diagonal branch, which admitted a 1.5 mm probe.  The vessel was approximately 1.6-1.8 mm in size.  Using a running 8-0 Prolene, left internal mammary artery was anastomosed to the large diagonal branch. With the crossclamp still in place and with cardioplegia being intermittently administered down the vein grafts, three punch aortotomies were performed in the ascending aorta.  Each of the 3 vein grafts were anastomosed to the ascending aorta.  Air was evacuated from the heart grafts and allowed to passively fill and de-air.  The proximal anastomoses were completed and aortic crossclamp was removed.  Total cross-clamp time was 160 minutes.  Air was evacuated from the vein grafts.  With removal of the bulldog on the diagonal artery, there was prompt filling and rising septal temperature on the anterior wall.  The patient spontaneously converted to a sinus rhythm.  Sites of anastomosis were inspected and free of bleeding.  He was then ventilated and weaned from cardiopulmonary bypass without difficulty.  He  remained hemodynamically stable was decannulated in usual fashion.  Protamine sulfate was administered with the operative field hemostatic.  A left pleural tube and a Blake mediastinal drain were left in place. Pericardium was loosely reapproximated.  Sternum was closed with #6 stainless steel wire.  Fascia closed with interrupted 0 Vicryl and 3-0 Vicryl subcutaneous tissue, and 4-0 subcuticular stitch in skin edges. Dry dressings were applied.  Sponge and needle count was reported as correct at the end of the procedure.  The patient tolerated the procedure without obvious complication.  He was transferred to Surgical Intensive Care Unit for further postoperative care.  Total pump time was 153 minutes.  The patient did not  require any blood bank blood products during the operative procedure.     Shawn Plane, MD     EG/MEDQ  D:  03/28/2012  T:  03/28/2012  Job:  [id number]

## 2012-03-28 NOTE — Progress Notes (Signed)
Patient ID: Shawn Carey, male   DOB: Jul 11, 1955, 56 y.o.   MRN: [id number] [facility] [place] PROGRESS NOTE                   [address]            [name] [address]          [phone]      1 Day Post-Op Procedure(s) (LRB): CORONARY ARTERY BYPASS GRAFTING (CABG) (N/A)  Total Length of Stay:  LOS: 4 days   Subjective: Extubated , good pain control, alert and orientated  Objective: Vital signs in last 24 hours: Temp:  [94.8 F (34.9 C)-100.8 F (38.2 C)] 99 F (37.2 C) (10/03 0700) Pulse Rate:  [73-82] 79  (10/03 0700) Cardiac Rhythm:  [-] Normal sinus rhythm (10/02 2000) Resp:  [12-33] 32  (10/03 0700) BP: (83-141)/(58-79) 119/79 mmHg (10/03 0700) SpO2:  [96 %-100 %] 98 % (10/03 0700) Arterial Line BP: (97-140)/(50-74) 126/63 mmHg (10/03 0700) FiO2 (%):  [40 %-50 %] 40 % (10/02 1820) Weight:  [168 lb 6.9 oz (76.4 kg)] 168 lb 6.9 oz (76.4 kg) (10/03 0500)  Filed Weights   03/24/12 2301 03/28/12 0500  Weight: 159 lb 6.3 oz (72.3 kg) 168 lb 6.9 oz (76.4 kg)    Weight change:    Hemodynamic parameters for last 24 hours: PAP: (16-30)/(6-18) 25/9 mmHg CO:  [4.1 L/min-4.8 L/min] 4.7 L/min CI:  [2.1 L/min/m2-2.5 L/min/m2] 2.4 L/min/m2  Intake/Output from previous day: 10/02 0701 - 10/03 0700 In: 7381.7 [I.V.:5558.7; Blood:993; NG/GT:30; IV Piggyback:800] Out: 2130 [QMVHQ:4696; Blood:2316; Chest Tube:330]  Intake/Output this shift:    Current Meds: Scheduled Meds:   . acetaminophen (TYLENOL) oral liquid 160 mg/5 mL  650 mg Per Tube NOW   Or  . acetaminophen  650 mg Rectal NOW  . acetaminophen  1,000 mg Oral Q6H   Or  . acetaminophen (TYLENOL) oral liquid 160 mg/5 mL  975 mg Per Tube Q6H  . aminocaproic acid (AMICAR) for OHS   Intravenous To OR  . aspirin EC  325 mg Oral Stamour   Or  . aspirin  324 mg Per Tube Zakrzewski  . atorvastatin  80 mg Oral q1800  . bisacodyl  10 mg Oral Macon   Or  . bisacodyl  10 mg Rectal Courtwright  . cefUROXime (ZINACEF)  IV   1.5 g Intravenous To OR  . cefUROXime (ZINACEF)  IV  1.5 g Intravenous Q12H  . docusate sodium  200 mg Oral Rodelo  . heparin-papaverine-plasmalyte irrigation   Irrigation To OR  . insulin aspart  0-24 Units Subcutaneous Q2H   Followed by  . insulin aspart  0-24 Units Subcutaneous Q4H  . insulin (NOVOLIN-R) infusion   Intravenous To OR  . levalbuterol  0.63 mg Nebulization TID  . magnesium sulfate  4 g Intravenous Once  . metoprolol tartrate  12.5 mg Oral BID   Or  . metoprolol tartrate  12.5 mg Per Tube BID  . pantoprazole  40 mg Oral Q1200  . potassium chloride  10 mEq Intravenous Q1 Hr x 3  . sodium chloride  3 mL Intravenous Q12H  . vancomycin  1,250 mg Intravenous To OR  . vancomycin  1,000 mg Intravenous Once  . DISCONTD: antiseptic oral rinse  15 mL Mouth Rinse BID  . DISCONTD: aspirin EC  81 mg Oral Lad  . DISCONTD: cefUROXime (ZINACEF)  IV  750 mg Intravenous To OR  . DISCONTD: dexmedetomidine  0.1-0.7 mcg/kg/hr  Intravenous To OR  . DISCONTD: DOPamine  2-20 mcg/kg/min Intravenous To OR  . DISCONTD: epinephrine  0.5-20 mcg/min Intravenous To OR  . DISCONTD: famotidine (PEPCID) IV  20 mg Intravenous Q12H  . DISCONTD: insulin regular  0-10 Units Intravenous TID WC  . DISCONTD: magnesium sulfate  40 mEq Other To OR  . DISCONTD: nitroGLYCERIN  2-200 mcg/min Intravenous To OR  . DISCONTD: phenylephrine (NEO-SYNEPHRINE) Adult infusion  30-200 mcg/min Intravenous To OR  . DISCONTD: potassium chloride  80 mEq Other To OR   Continuous Infusions:   . sodium chloride 20 mL/hr at 03/28/12 0600  . sodium chloride Stopped (03/27/12 1500)  . sodium chloride    . dexmedetomidine Stopped (03/28/12 0400)  . lactated ringers 20 mL/hr at 03/28/12 0600  . nitroGLYCERIN 10 mcg/min (03/28/12 0600)  . phenylephrine (NEO-SYNEPHRINE) Adult infusion Stopped (03/27/12 1500)  . DISCONTD: heparin Stopped (03/27/12 0650)  . DISCONTD: insulin (NOVOLIN-R) infusion Stopped (03/27/12 1700)  .  DISCONTD: nitroGLYCERIN Stopped (03/27/12 0837)   PRN Meds:.albumin human, lactated ringers, metoprolol, morphine injection, morphine injection, ondansetron (ZOFRAN) IV, oxyCODONE, sodium chloride, DISCONTD: 0.9 % irrigation (POUR BTL), DISCONTD: acetaminophen, DISCONTD: diphenhydrAMINE, DISCONTD: hemostatic agents, DISCONTD: hemostatic agents, DISCONTD: midazolam, DISCONTD: ondansetron (ZOFRAN) IV, DISCONTD: Surgifoam 1 Gm with 0.9% sodium chloride (4 ml) topical solution  General appearance: alert and cooperative Neurologic: intact Heart: regular rate and rhythm, S1, S2 normal, no murmur, click, rub or gallop and normal apical impulse Lungs: clear to auscultation bilaterally and normal percussion bilaterally Abdomen: soft, non-tender; bowel sounds normal; no masses,  no organomegaly Extremities: extremities normal, atraumatic, no cyanosis or edema and Homans sign is negative, no sign of DVT Wound: sternum stable  Lab Results: CBC: Basename 03/28/12 0400 03/27/12 2050  WBC 12.9* 15.9*  HGB 12.6* 13.6  HCT 37.0* 39.6  PLT 139* 137*   BMET:  Basename 03/28/12 0400 03/27/12 2050 03/27/12 2040 03/27/12 0350  NA 136 -- 137 --  K 4.6 -- 4.8 --  CL 107 -- 108 --  CO2 23 -- -- 29  GLUCOSE 124* -- 132* --  BUN 13 -- 12 --  CREATININE 0.84 0.80 -- --  CALCIUM 8.1* -- -- 9.4    PT/INR:  Basename 03/27/12 1500  LABPROT 16.2*  INR 1.33   Radiology: Dg Chest 2 View  03/26/2012  *RADIOLOGY REPORT*  Clinical Data: Preop for CABG surgery.  Smoking history.  CHEST - 2 VIEW  Comparison: 03/24/2012  Findings: The heart, mediastinum and hila are unremarkable.  The lungs are mildly hyperexpanded.  There is mild interstitial thickening most evident at the lung bases.  There is stable pleural parenchymal scarring at the apices.  There are no lung nodules or masses.  There are no acute findings in the lungs.  The bony thorax is demineralized but intact.  IMPRESSION: No acute cardiopulmonary disease.   There is interstitial thickening, chronic likely due to mild interstitial fibrosis as well as stable apical pleural parenchymal scarring.   Original Report Authenticated By: Domenic Moras, M.D.    Dg Chest Portable 1 View  03/27/2012  *RADIOLOGY REPORT*  Clinical Data: Post CABG  PORTABLE CHEST - 1 VIEW  Comparison: Chest x-ray of 03/26/2012  Findings: The endotracheal tube tip is approximately 4.6 cm above the carina.  A left chest tube is present and no left pneumothorax is seen.  However there is a small right apical pneumothorax present. Bibasilar linear atelectasis right greater than left is present.  A Swan-Ganz catheter is noted  with the tip in the right lower lobe pulmonary artery.  Median sternotomy sutures are present from CABG.  IMPRESSION:  1.  Tip of endotracheal tube 4.6 cm above the carina. 2.  Left chest tube present. 3.  Small right apical pneumothorax. 4.  Basilar atelectasis.   Original Report Authenticated By: Juline Patch, M.D.      Assessment/Plan: S/P Procedure(s) (LRB): CORONARY ARTERY BYPASS GRAFTING (CABG) (N/A) Mobilize Diuresis d/c tubes/lines Continue foley due to diuresing patient, strict I&O, patient in ICU and urinary output monitoring See progression orders Expected Acute  Blood - loss Anemia    Delight Ovens MD  Beeper [phone] Office [phone] 03/28/2012 7:47 AM

## 2012-03-29 ENCOUNTER — Inpatient Hospital Stay (HOSPITAL_COMMUNITY): Payer: 59

## 2012-03-29 LAB — BASIC METABOLIC PANEL
BUN: 17 mg/dL (ref 6–23)
CO2: 27 mEq/L (ref 19–32)
Calcium: 8.4 mg/dL (ref 8.4–10.5)
Chloride: 99 mEq/L (ref 96–112)
Creatinine, Ser: 0.76 mg/dL (ref 0.50–1.35)
GFR calc Af Amer: >90 mL/min (ref >90)
GFR calc non Af Amer: >90 mL/min (ref >90)
Glucose, Bld: 113 mg/dL — ABNORMAL HIGH (ref 70–99)
Potassium: 4 mEq/L (ref 3.5–5.1)
Sodium: 133 mEq/L — ABNORMAL LOW (ref 135–145)

## 2012-03-29 LAB — GLUCOSE, CAPILLARY
Glucose-Capillary: 108 mg/dL — ABNORMAL HIGH (ref 70–99)
Glucose-Capillary: 93 mg/dL (ref 70–99)
Glucose-Capillary: 93 mg/dL (ref 70–99)
Glucose-Capillary: 94 mg/dL (ref 70–99)

## 2012-03-29 LAB — CBC
HCT: 35.3 % — ABNORMAL LOW (ref 39.0–52.0)
Hemoglobin: 11.9 g/dL — ABNORMAL LOW (ref 13.0–17.0)
MCH: 30.3 pg (ref 26.0–34.0)
MCHC: 33.7 g/dL (ref 30.0–36.0)
MCV: 89.8 fL (ref 78.0–100.0)
Platelets: 137 10*3/uL — ABNORMAL LOW (ref 150–400)
RBC: 3.93 MIL/uL — ABNORMAL LOW (ref 4.22–5.81)
RDW: 13.3 % (ref 11.5–15.5)
WBC: 11.7 10*3/uL — ABNORMAL HIGH (ref 4.0–10.5)

## 2012-03-29 MED ORDER — TRAMADOL HCL 50 MG PO TABS: 50.0000 mg | ORAL_TABLET | ORAL | Status: DC | PRN

## 2012-03-29 MED ORDER — DOCUSATE SODIUM 100 MG PO CAPS
200.0000 mg | ORAL_CAPSULE | Freq: Every day | ORAL | Status: DC
  Administered 2012-03-29 – 2012-03-30 (×2): 200 mg via ORAL
  Filled 2012-03-29 (×3): qty 2

## 2012-03-29 MED ORDER — SODIUM CHLORIDE 0.9 % IJ SOLN
3.0000 mL | Freq: Two times a day (BID) | INTRAMUSCULAR | Status: DC
  Administered 2012-03-29 – 2012-03-31 (×5): 3 mL via INTRAVENOUS

## 2012-03-29 MED ORDER — BISACODYL 10 MG RE SUPP: 10.0000 mg | Freq: Every day | RECTAL | Status: DC | PRN

## 2012-03-29 MED ORDER — OXYCODONE HCL 5 MG PO TABS
5.0000 mg | ORAL_TABLET | ORAL | Status: DC | PRN
  Administered 2012-03-29 – 2012-04-01 (×11): 10 mg via ORAL
  Filled 2012-03-29 (×11): qty 2

## 2012-03-29 MED ORDER — ALUM & MAG HYDROXIDE-SIMETH 200-200-20 MG/5ML PO SUSP: 15.0000 mL | ORAL | Status: DC | PRN

## 2012-03-29 MED ORDER — GUAIFENESIN ER 600 MG PO TB12
600.0000 mg | ORAL_TABLET | Freq: Two times a day (BID) | ORAL | Status: DC | PRN
  Filled 2012-03-29: qty 1

## 2012-03-29 MED ORDER — ASPIRIN EC 325 MG PO TBEC
325.0000 mg | DELAYED_RELEASE_TABLET | Freq: Every day | ORAL | Status: DC
  Administered 2012-03-29 – 2012-03-31 (×3): 325 mg via ORAL
  Filled 2012-03-29 (×4): qty 1

## 2012-03-29 MED ORDER — SODIUM CHLORIDE 0.9 % IV SOLN: 250.0000 mL | INTRAVENOUS | Status: DC | PRN

## 2012-03-29 MED ORDER — PANTOPRAZOLE SODIUM 40 MG PO TBEC
40.0000 mg | DELAYED_RELEASE_TABLET | Freq: Every day | ORAL | Status: DC
  Administered 2012-03-30 – 2012-03-31 (×2): 40 mg via ORAL
  Filled 2012-03-29 (×3): qty 1

## 2012-03-29 MED ORDER — SODIUM CHLORIDE 0.9 % IJ SOLN: 3.0000 mL | INTRAMUSCULAR | Status: DC | PRN

## 2012-03-29 MED ORDER — INSULIN ASPART 100 UNIT/ML ~~LOC~~ SOLN: 0.0000 [IU] | Freq: Three times a day (TID) | SUBCUTANEOUS | Status: DC

## 2012-03-29 MED ORDER — MOVING RIGHT ALONG BOOK
Freq: Once | Status: AC
  Administered 2012-03-29: 08:00:00
  Filled 2012-03-29: qty 1

## 2012-03-29 MED ORDER — BISACODYL 5 MG PO TBEC
10.0000 mg | DELAYED_RELEASE_TABLET | Freq: Every day | ORAL | Status: DC | PRN
  Administered 2012-03-30 – 2012-03-31 (×2): 10 mg via ORAL
  Filled 2012-03-29: qty 2
  Filled 2012-03-29: qty 1

## 2012-03-29 MED ORDER — METOPROLOL TARTRATE 12.5 MG HALF TABLET
12.5000 mg | ORAL_TABLET | Freq: Two times a day (BID) | ORAL | Status: DC
  Administered 2012-03-29 – 2012-03-31 (×6): 12.5 mg via ORAL
  Filled 2012-03-29 (×8): qty 1

## 2012-03-29 MED FILL — Lidocaine HCl IV Inj 20 MG/ML: INTRAVENOUS | Qty: 5 | Status: AC

## 2012-03-29 MED FILL — Heparin Sodium (Porcine) Inj 1000 Unit/ML: INTRAMUSCULAR | Qty: 20 | Status: AC

## 2012-03-29 MED FILL — Sodium Bicarbonate IV Soln 8.4%: INTRAVENOUS | Qty: 50 | Status: AC

## 2012-03-29 MED FILL — Mannitol IV Soln 20%: INTRAVENOUS | Qty: 500 | Status: AC

## 2012-03-29 MED FILL — Sodium Chloride IV Soln 0.9%: INTRAVENOUS | Qty: 1000 | Status: AC

## 2012-03-29 MED FILL — Electrolyte-R (PH 7.4) Solution: INTRAVENOUS | Qty: 4000 | Status: AC

## 2012-03-29 MED FILL — Heparin Sodium (Porcine) Inj 1000 Unit/ML: INTRAMUSCULAR | Qty: 30 | Status: AC

## 2012-03-29 MED FILL — Sodium Chloride Irrigation Soln 0.9%: Qty: 3000 | Status: AC

## 2012-03-29 NOTE — Progress Notes (Signed)
Pt ambulated on 3L [place] with 2 assist. Ambulated approx 500 feet. Tolerated well. Educated pt on importance of breathing and using IS.

## 2012-03-29 NOTE — Progress Notes (Signed)
CARDIAC REHAB PHASE I   PRE:  Rate/Rhythm: 86SR  BP:  Supine:   Sitting: 128/78  Standing:    SaO2: 96-97%2L,97%RA  MODE:  Ambulation: 350 ft   POST:  Rate/Rhythem: 107ST  BP:  Supine: 163/82  Sitting:   Standing:    SaO2: 95%2L 1300-1340 Pt walked 350 ft with rolling walker and asst x 2. Started out on RA but pt desat to 88%. Put on 2L to keep sats up. Encouraged IS. To bed after walk. Can be asst x 1 next walk. Stopped a couple of times to rest.  Duanne Limerick

## 2012-03-29 NOTE — Progress Notes (Signed)
Patient ID: Shawn Carey, male   DOB: 12/18/1955, 56 y.o.   MRN: [id number] [facility] PROGRESS NOTE                   [address]            [name] [address]          [phone]      2 Days Post-Op Procedure(s) (LRB): CORONARY ARTERY BYPASS GRAFTING (CABG) (N/A)  Total Length of Stay:  LOS: 5 days   Subjective: Up to chair feels well, better pain control  Objective: Vital signs in last 24 hours: Temp:  [97.8 F (36.6 C)-98.9 F (37.2 C)] 98.7 F (37.1 C) (10/04 0753) Pulse Rate:  [74-95] 90  (10/04 0700) Cardiac Rhythm:  [-] Normal sinus rhythm (10/03 2000) Resp:  [14-39] 35  (10/04 0700) BP: (104-153)/(72-90) 136/75 mmHg (10/04 0700) SpO2:  [95 %-100 %] 95 % (10/04 0700) Arterial Line BP: (160)/(72) 160/72 mmHg (10/03 0900) Weight:  [167 lb 5.3 oz (75.9 kg)] 167 lb 5.3 oz (75.9 kg) (10/04 0200)  Filed Weights   03/24/12 2301 03/28/12 0500 03/29/12 0200  Weight: 159 lb 6.3 oz (72.3 kg) 168 lb 6.9 oz (76.4 kg) 167 lb 5.3 oz (75.9 kg)    Weight change: -1 lb 1.6 oz (-0.5 kg)   Hemodynamic parameters for last 24 hours: PAP: (34)/(18) 34/18 mmHg CO:  [4.3 L/min] 4.3 L/min CI:  [2.2 L/min/m2] 2.2 L/min/m2  Intake/Output from previous day: 10/03 0701 - 10/04 0700 In: 951.5 [P.O.:250; I.V.:601.5; IV Piggyback:100] Out: 835 [Urine:675; Chest Tube:160]  Intake/Output this shift:    Current Meds: Scheduled Meds:   . acetaminophen  1,000 mg Oral Q6H   Or  . acetaminophen (TYLENOL) oral liquid 160 mg/5 mL  975 mg Per Tube Q6H  . aspirin EC  325 mg Oral Fogal   Or  . aspirin  324 mg Per Tube [place]uebker  . atorvastatin  80 mg Oral q1800  . bisacodyl  10 mg Oral Rauth   Or  . bisacodyl  10 mg Rectal Mancias  . cefUROXime (ZINACEF)  IV  1.5 g Intravenous Q12H  . docusate sodium  200 mg Oral Schwake  . enoxaparin  30 mg Subcutaneous Q24H  . insulin aspart  0-24 Units Subcutaneous Q4H  . levalbuterol  0.63 mg Nebulization TID  . metoprolol tartrate   12.5 mg Oral BID   Or  . metoprolol tartrate  12.5 mg Per Tube BID  . morphine      . pantoprazole  40 mg Oral Q1200  . sodium chloride  3 mL Intravenous Q12H   Continuous Infusions:   . sodium chloride Stopped (03/28/12 1000)  . sodium chloride Stopped (03/27/12 1500)  . sodium chloride    . dexmedetomidine Stopped (03/28/12 0400)  . lactated ringers 20 mL/hr at 03/29/12 0700  . nitroGLYCERIN Stopped (03/29/12 0500)  . phenylephrine (NEO-SYNEPHRINE) Adult infusion Stopped (03/27/12 1500)   PRN Meds:.albumin human, metoprolol, morphine injection, ondansetron (ZOFRAN) IV, oxyCODONE, sodium chloride  General appearance: alert and cooperative Neurologic: intact Heart: regular rate and rhythm, S1, S2 normal, no murmur, click, rub or gallop and normal apical impulse Lungs: clear to auscultation bilaterally and normal percussion bilaterally Abdomen: soft, non-tender; bowel sounds normal; no masses,  no organomegaly Extremities: extremities normal, atraumatic, no cyanosis or edema and Homans sign is negative, no sign of DVT Wound: sternum stable  Lab Results: CBC: Basename 03/29/12 0442 03/28/12 1718  WBC 11.7* 15.8*  HGB 11.9* 13.0  HCT 35.3* 38.8*  PLT 137* 157   BMET:  Basename 03/29/12 0442 03/28/12 1718 03/28/12 0400  NA 133* -- 136  K 4.0 -- 4.6  CL 99 -- 107  CO2 27 -- 23  GLUCOSE 113* -- 124*  BUN 17 -- 13  CREATININE 0.76 0.80 --  CALCIUM 8.4 -- 8.1*    PT/INR:  Basename 03/27/12 1500  LABPROT 16.2*  INR 1.33   Radiology: Dg Chest Portable 1 View In Am  03/28/2012  *RADIOLOGY REPORT*  Clinical Data: Status post CABG  PORTABLE CHEST - 1 VIEW  Comparison: 03/27/2012  Findings: The endotracheal tube has been removed.  There is a right sided IJ catheter with tip in the projection of the right pulmonary artery. There is right basilar opacity is unchanged from prior exam.  There is been interval development of a left pleural effusion.  IMPRESSION:  1.  Increase in left  pleural effusion. 2.  Stable right basilar opacity.   Original Report Authenticated By: Rosealee Albee, M.D.    Dg Chest Portable 1 View  03/27/2012  *RADIOLOGY REPORT*  Clinical Data: Post CABG  PORTABLE CHEST - 1 VIEW  Comparison: Chest x-ray of 03/26/2012  Findings: The endotracheal tube tip is approximately 4.6 cm above the carina.  A left chest tube is present and no left pneumothorax is seen.  However there is a small right apical pneumothorax present. Bibasilar linear atelectasis right greater than left is present.  A Swan-Ganz catheter is noted with the tip in the right lower lobe pulmonary artery.  Median sternotomy sutures are present from CABG.  IMPRESSION:  1.  Tip of endotracheal tube 4.6 cm above the carina. 2.  Left chest tube present. 3.  Small right apical pneumothorax. 4.  Basilar atelectasis.   Original Report Authenticated By: Juline Patch, M.D.      Assessment/Plan: S/P Procedure(s) (LRB): CORONARY ARTERY BYPASS GRAFTING (CABG) (N/A) Mobilize Diuresis d/c tubes/lines Plan for transfer to step-down: see transfer orders     Ares Tegtmeyer B 03/29/2012 7:57 AM

## 2012-03-30 ENCOUNTER — Inpatient Hospital Stay (HOSPITAL_COMMUNITY): Payer: 59

## 2012-03-30 LAB — CBC
HCT: 33.5 % — ABNORMAL LOW (ref 39.0–52.0)
Hemoglobin: 11.2 g/dL — ABNORMAL LOW (ref 13.0–17.0)
MCH: 29.5 pg (ref 26.0–34.0)
MCHC: 33.4 g/dL (ref 30.0–36.0)
MCV: 88.2 fL (ref 78.0–100.0)
Platelets: 155 10*3/uL (ref 150–400)
RBC: 3.8 MIL/uL — ABNORMAL LOW (ref 4.22–5.81)
RDW: 13.4 % (ref 11.5–15.5)
WBC: 10.9 10*3/uL — ABNORMAL HIGH (ref 4.0–10.5)

## 2012-03-30 LAB — GLUCOSE, CAPILLARY
Glucose-Capillary: 108 mg/dL — ABNORMAL HIGH (ref 70–99)
Glucose-Capillary: 98 mg/dL (ref 70–99)

## 2012-03-30 LAB — BASIC METABOLIC PANEL
BUN: 16 mg/dL (ref 6–23)
CO2: 28 mEq/L (ref 19–32)
Calcium: 8.6 mg/dL (ref 8.4–10.5)
Chloride: 99 mEq/L (ref 96–112)
Creatinine, Ser: 0.78 mg/dL (ref 0.50–1.35)
GFR calc Af Amer: >90 mL/min (ref >90)
GFR calc non Af Amer: >90 mL/min (ref >90)
Glucose, Bld: 108 mg/dL — ABNORMAL HIGH (ref 70–99)
Potassium: 3.8 mEq/L (ref 3.5–5.1)
Sodium: 134 mEq/L — ABNORMAL LOW (ref 135–145)

## 2012-03-30 MED ORDER — LISINOPRIL 2.5 MG PO TABS
2.5000 mg | ORAL_TABLET | Freq: Every day | ORAL | Status: DC
  Administered 2012-03-30 – 2012-03-31 (×2): 2.5 mg via ORAL
  Filled 2012-03-30 (×3): qty 1

## 2012-03-30 NOTE — Progress Notes (Addendum)
                    [address]            [place] [address]          [phone]     3 Days Post-Op Procedure(s) (LRB): CORONARY ARTERY BYPASS GRAFTING (CABG) (N/A)  Subjective: A little tired after walking with cardiac rehab, but otherwise feels ok.  Coughed a lot last night, mostly clear-blood tinged sputum.   Objective: Vital signs in last 24 hours: Patient Vitals for the past 24 hrs:  BP Temp Temp src Pulse Resp SpO2 Weight  03/30/12 0608 134/89 mmHg 98.3 F (36.8 C) Oral 100  20  96 % 165 lb 5.5 oz (75 kg)  03/29/12 2017 134/79 mmHg 100.5 F (38.1 C) Oral 110  20  96 % -  03/29/12 1424 137/85 mmHg 97.6 F (36.4 C) Oral 99  18  97 % -  03/29/12 1136 146/92 mmHg 98.4 F (36.9 C) Oral 94  17  97 % -  03/29/12 1100 125/73 mmHg - - 84  15  97 % -  03/29/12 1000 116/70 mmHg - - 93  23  95 % -   Current Weight  03/30/12 165 lb 5.5 oz (75 kg)  PRE-OPERATIVE WEIGHT: 72 kg   Intake/Output from previous day: 10/04 0701 - 10/05 0700 In: 430 [P.O.:410; I.V.:20] Out: 680 [Urine:570; Chest Tube:110]     PHYSICAL EXAM:  Heart: RRR Lungs: clear Wound: clean and dry Extremities: mild LE edema, R>L    Lab Results: CBC: Basename 03/30/12 0500 03/29/12 0442  WBC 10.9* 11.7*  HGB 11.2* 11.9*  HCT 33.5* 35.3*  PLT 155 137*   BMET:  Basename 03/30/12 0500 03/29/12 0442  NA 134* 133*  K 3.8 4.0  CL 99 99  CO2 28 27  GLUCOSE 108* 113*  BUN 16 17  CREATININE 0.78 0.76  CALCIUM 8.6 8.4    PT/INR:  Basename 03/27/12 1500  LABPROT 16.2*  INR 1.33   CXR: Findings: 0711 hours. Left chest tube has been removed in the  interval. There is a 5% left apical pneumothorax. There is no  associated mediastinal shift. Small pleural effusions and  bibasilar atelectasis are unchanged. Heart size and mediastinal  contours are stable.  IMPRESSION:  Small left apical pneumothorax following left chest tube removal.  Small bilateral pleural effusions and mild  bibasilar atelectasis.     Assessment/Plan: S/P Procedure(s) (LRB): CORONARY ARTERY BYPASS GRAFTING (CABG) (N/A) CV- stable, SR. BPs trending up.  Will continue Lopressor, start low dose ACE-I. Vol overload- diurese. CRPI, pulm toilet.   LOS: 6 days    Carey,Shawn H 03/30/2012   I have seen and examined Shawn Carey and agree with the above assessment  and plan.  Delight Ovens MD Beeper [phone] Office [phone] 03/30/2012 11:48 AM

## 2012-03-30 NOTE — Progress Notes (Signed)
Pt ambulated x1 assist with nursing staff and family. 840 feet on O2, pt tolerated well, back in bed call bell with in reach.will continue to monitor patient Shawn Carey, Randall An RN

## 2012-03-30 NOTE — Progress Notes (Signed)
Pt ambulated 890 feet x1 assist and 02 at 2L pt tolerated well, assisted back to chair call bell with in reach will continue to monitor patient. Oneta Sigman, Randall An RN

## 2012-03-30 NOTE — Progress Notes (Signed)
CARDIAC REHAB PHASE I   PRE:  Rate/Rhythm: Sinus Rhythm 90  BP:    Sitting: 133/74     SaO2: 97 3L  MODE:  Ambulation: 680 ft   POST:  Rate/Rhythem: Sinus Tach 104  BP:    Sitting: 143/81     SaO2: 97%2L Decreased to 1L  [id number]  Patient ambulated in the hallway with assistance times one on 2L of oxygen.  Increased distance. Patient assisted back to bed with call bell within reach. Oxygen decreased to 1L via [place]. Encouraged use of incentive spirometer.  Harlon Flor, Arta Bruce

## 2012-03-31 MED ORDER — ATORVASTATIN CALCIUM 80 MG PO TABS: 80.0000 mg | ORAL_TABLET | Freq: Every day | ORAL | Status: DC

## 2012-03-31 MED ORDER — METOPROLOL TARTRATE 12.5 MG HALF TABLET: 12.5000 mg | ORAL_TABLET | Freq: Two times a day (BID) | ORAL | Status: DC

## 2012-03-31 MED ORDER — OXYCODONE HCL 5 MG PO TABS: 5.0000 mg | ORAL_TABLET | ORAL | Status: DC | PRN

## 2012-03-31 MED ORDER — LISINOPRIL 2.5 MG PO TABS: 2.5000 mg | ORAL_TABLET | Freq: Every day | ORAL | Status: DC

## 2012-03-31 MED ORDER — ASPIRIN 325 MG PO TBEC: 325.0000 mg | DELAYED_RELEASE_TABLET | Freq: Every day | ORAL | Status: DC

## 2012-03-31 MED ORDER — MAGNESIUM HYDROXIDE 400 MG/5ML PO SUSP
30.0000 mL | Freq: Every day | ORAL | Status: DC | PRN
  Administered 2012-03-31: 30 mL via ORAL
  Filled 2012-03-31: qty 30

## 2012-03-31 NOTE — Progress Notes (Addendum)
                    [address]            [place], [address]          [phone]     4 Days Post-Op Procedure(s) (LRB): CORONARY ARTERY BYPASS GRAFTING (CABG) (N/A)  Subjective: Feels well.  Walking independently in halls.  C/o constipation.  Objective: Vital signs in last 24 hours: Patient Vitals for the past 24 hrs:  BP Temp Temp src Pulse Resp SpO2 Weight  03/31/12 0801 - - - - - 96 % -  03/31/12 0530 - - - - - - 163 lb 6.4 oz (74.118 kg)  03/31/12 0529 134/79 mmHg 99.4 F (37.4 C) Oral 102  22  94 % -  03/30/12 1940 136/82 mmHg 99.8 F (37.7 C) Oral 108  20  93 % -  03/30/12 1425 119/73 mmHg 98.5 F (36.9 C) - 103  18  96 % -  03/30/12 1022 143/81 mmHg - - 100  - - -   Current Weight  03/31/12 163 lb 6.4 oz (74.118 kg)  PRE-OPERATIVE WEIGHT: 72 kg    Intake/Output from previous day: 10/05 0701 - 10/06 0700 In: 723 [P.O.:720; I.V.:3] Out: 450 [Urine:450]    PHYSICAL EXAM:  Heart: RRR Lungs: clear Wound: clean and dry Extremities: mild LE edema, R>L   Lab Results: CBC: Basename 03/30/12 0500 03/29/12 0442  WBC 10.9* 11.7*  HGB 11.2* 11.9*  HCT 33.5* 35.3*  PLT 155 137*   BMET:  Basename 03/30/12 0500 03/29/12 0442  NA 134* 133*  K 3.8 4.0  CL 99 99  CO2 28 27  GLUCOSE 108* 113*  BUN 16 17  CREATININE 0.78 0.76  CALCIUM 8.6 8.4    PT/INR: No results found for this basename: LABPROT,INR in the last 72 hours    Assessment/Plan: S/P Procedure(s) (LRB): CORONARY ARTERY BYPASS GRAFTING (CABG) (N/A) CV- BPs, HR stable. Continue current meds. Rx LOC today. CRPI, pulm toilet. Home 1-2 days if remains stable.   LOS: 7 days    COLLINS,GINA H 03/31/2012  hope home in am I have seen and examined Mihir Trouten and agree with the above assessment  and plan.  Delight Ovens MD Beeper [phone] Office [phone] 03/31/2012 10:29 AM

## 2012-03-31 NOTE — Progress Notes (Signed)
Pt EPW pulled per protocol and as ordered. All ends intact, pt tolerated well pt SR 100 on monitor and BP 136/74 CT sutures DCd as ordered and steri strips applied. Pt reminded to lie supine approximately one hour. Lonie Rummell, Randall An RN

## 2012-03-31 NOTE — Progress Notes (Signed)
Pt ambulated 800 feet with wife on room air tolerated well, pt back in bed on call bell in reach. Sats 94% on RA will monitor patient. Khiley Lieser, Randall An RN

## 2012-03-31 NOTE — Progress Notes (Signed)
Pt ambulated in hallway with family, 870 feet. Pt back in room and in chair. Will continue to monitor patient. Montzerrat Brunell, Randall An RN

## 2012-03-31 NOTE — Discharge Summary (Signed)
[address]            Shawn Carey [address]          [phone]         Discharge Summary  Name: Shawn Carey DOB: Aug 13, 1955 56 y.o. MRN: [id number]   Admission Date: 03/24/2012 Discharge Date:     Admitting Diagnosis: Chest pain  Discharge Diagnosis:  Principal Problem:  *MI, acute, non ST segment elevation Tobacco abuse   Past Medical History  Diagnosis Date  . 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 Pneumothorax       Procedures: CORONARY ARTERY BYPASS GRAFTING x 7 (left  internal mammary to the dominant diagonal coronary artery, sequential saphenous vein graft to the distal branches of the bifurcated left anterior descending, sequential saphenous vein graft to the first obtuse marginal and distal circumflex, sequential saphenous vein graft to the posterior descending and posterolateral branches of the right coronary artery) - 03/27/2012   HPI:  The patient is a 56 y.o. male who presented to [facility] on 03/24/2012 with 3 days of chest pain. He stated that it started initially as the sensation of something stuck in his throat and some radiation to his neck. Over the next couple of days, it worsened and then on the day of presentation, it was chest pain across the left side of his chest, radiating to his arms, diaphoresis and nausea. In retrospect, he reported exertional chest discomfort for the last 3 or so years that he has ignored. He initially sought care at [facility]. Cardiac enzymes showed a Troponin I 1.36, CPK MB 29.6, relative index 10.4, and total CPK 284. after troponins found to be elevated. He ruled in for a NSTEMI. He was then transferred to [facility] for further evaluation and treatment.   Hospital Course:  The patient was admitted to [facility] on 9/29/2013He was started on heparin and nitroglycerin drips, as his initial BP was in the 80s. He underwent a cardiac catheterization by  Dr. Riley Kill on 03/25/2012 . He was found to have three vessel coronary artery disease. A cardiothoracic consultation was requested for consideration of coronary artery bypass surgery. The patient was seen by Dr. Tyrone Sage and his films were reviewed.  He recommended proceeding with surgical revascularization.  All risks, benefits and alternatives of surgery were explained in detail, and the patient agreed to proceed.   The patient was taken to the operating room and underwent the above procedure.    The postoperative course has been uneventful.  His lines and tubes were removed in the standard fashion and he was transferred to stepdown on postop day 2.  He has been mildly volume overloaded, and was started on a short course of Lasix.  He was started on an aspirin, a statin, a beta blocker and an ACE inhibitor for his recent MI.  He is ambulating in the halls without difficulty.  He is tolerating a regular diet.  We anticipate discharge in the next 24 hours, provided he remains stable.    Recent vital signs:  Filed Vitals:   03/31/12 1017  BP: 130/82  Pulse: 100  Temp:   Resp:     Recent laboratory studies:  CBC: Basename 03/30/12 0500 03/29/12 0442  WBC 10.9* 11.7*  HGB 11.2* 11.9*  HCT 33.5* 35.3*  PLT 155 137*   BMET:  Basename 03/30/12 0500 03/29/12 0442  NA 134* 133*  K 3.8 4.0  CL 99 99  CO2 28 27  GLUCOSE 108* 113*  BUN 16 17  CREATININE 0.78 0.76  CALCIUM 8.6 8.4    PT/INR: No results found for this basename: LABPROT,INR in the last 72 hours   Discharge Medications:     Medication List     As of 03/31/2012 10:29 AM    STOP taking these medications         NYQUIL PO      TAKE these medications         aspirin 325 MG EC tablet   Take 1 tablet (325 mg total) by mouth [place]field.      atorvastatin 80 MG tablet   Commonly known as: LIPITOR   Take 1 tablet (80 mg total) by mouth Robleto.      lisinopril 2.5 MG tablet   Commonly known as: PRINIVIL,ZESTRIL   Take 1  tablet (2.5 mg total) by mouth Gowdy.      metoprolol tartrate 12.5 mg Tabs   Commonly known as: LOPRESSOR   Take 0.5 tablets (12.5 mg total) by mouth 2 (two) times Calvo.      oxyCODONE 5 MG immediate release tablet   Commonly known as: Oxy IR/ROXICODONE   Take 1-2 tablets (5-10 mg total) by mouth every 3 (three) hours as needed for pain.         Discharge Instructions:  The patient is to refrain from driving, heavy lifting or strenuous activity.  May shower [place] and clean incisions with soap and water.  May resume regular diet.   Follow Up:        Follow-up Information    Follow up with GERHARDT,EDWARD B, MD. (Office will contact you with an appointment)    Contact information:   [address] [employer] [address] [place] [address] [phone]       Follow up with [facility]. Schedule an appointment as soon as possible for a visit in 2 weeks. (Call for cardiology followup)    Contact information:   [address] [place] [address]           Leviticus Harton H 03/31/2012, 10:29 AM

## 2012-03-31 NOTE — Progress Notes (Signed)
Pt ambulated in hallway with wife 750 feet. Tolerated well a little sore while walking this time. Will monitor patient. Yeimi Debnam, Randall An rN

## 2012-04-01 NOTE — Progress Notes (Signed)
[id number] Cardiac Rehab Completed discharge education with pt and wife. He voices understanding. Discussed smoking cessation with pt. Pt given tips for quitting and coaching contact numbers.He agrees to [employer]. CRP in program in [place], will send referral.

## 2012-04-01 NOTE — Progress Notes (Signed)
Pt discharge instructions given and patient education complete. Iv site d/c. Site WNL. Sutures d/c yesterday. Site WNL. D/C home with wife. Dion Saucier

## 2012-04-01 NOTE — Progress Notes (Signed)
5 Days Post-Op Procedure(s) (LRB): CORONARY ARTERY BYPASS GRAFTING (CABG) (N/A) Subjective:  Mr. Lux has no complaints this morning.  He is ready to be discharged home  Objective: Vital signs in last 24 hours: Temp:  [98.2 F (36.8 C)-98.5 F (36.9 C)] 98.2 F (36.8 C) (10/07 0500) Pulse Rate:  [90-111] 98  (10/07 0500) Cardiac Rhythm:  [-] Sinus tachycardia (10/06 2108) Resp:  [18-22] 22  (10/07 0500) BP: (121-137)/(72-86) 137/86 mmHg (10/07 0500) SpO2:  [92 %-96 %] 95 % (10/07 0500) Weight:  [163 lb (73.936 kg)] 163 lb (73.936 kg) (10/07 0500)    Intake/Output from previous day: 10/06 0701 - 10/07 0700 In: 243 [P.O.:240; I.V.:3] Out: 725 [Urine:725]  General appearance: alert, cooperative and no distress Neurologic: intact Heart: regular rate and rhythm, S1, S2 normal, no murmur, click, rub or gallop Lungs: clear to auscultation bilaterally Abdomen: soft, non-tender; bowel sounds normal; no masses,  no organomegaly Extremities: extremities normal, atraumatic, no cyanosis or edema Wound: clean and dry  Lab Results:  Basename 03/30/12 0500  WBC 10.9*  HGB 11.2*  HCT 33.5*  PLT 155   BMET:  Basename 03/30/12 0500  NA 134*  K 3.8  CL 99  CO2 28  GLUCOSE 108*  BUN 16  CREATININE 0.78  CALCIUM 8.6    PT/INR: No results found for this basename: LABPROT,INR in the last 72 hours ABG    Component Value Date/Time   PHART 7.344* 03/27/2012 2013   HCO3 21.7 03/27/2012 2013   TCO2 19 03/27/2012 2040   ACIDBASEDEF 3.0* 03/27/2012 2013   O2SAT 97.0 03/27/2012 2013   CBG (last 3)   Basename 03/31/12 0551 03/30/12 2108 03/30/12 1628  GLUCAP 88 109* 82    Assessment/Plan: S/P Procedure(s) (LRB): CORONARY ARTERY BYPASS GRAFTING (CABG) (N/A)  1. CV- NSR, tachy continue Lopressor and Lisinopirl, may need to increase dose if tachycardia doesn't improve 2. Pulm- no acute issues 3. Volume Overload- continue diuresis 4. Dispo- patient doing well, will d/c home  today   LOS: 8 days    Nathanyal Ashmead 04/01/2012

## 2012-04-05 ENCOUNTER — Telehealth: Payer: Self-pay

## 2012-04-05 DIAGNOSIS — G8918 Other acute postprocedural pain: Secondary | ICD-10-CM

## 2012-04-05 MED ORDER — TRAMADOL HCL 50 MG PO TABS: 50.0000 mg | ORAL_TABLET | Freq: Four times a day (QID) | ORAL | Status: DC | PRN

## 2012-04-05 NOTE — Telephone Encounter (Signed)
I recommend that he stop the pain medication, and take Benadryl 50 mg. He denies any dyspnea or swelling, no fevers. He does have a small amount of serosanguinous drainage from one of his chest tube sites. I will call in Tramadol to manage his pain prn. He will call back if rash does not improve in 24 hours.

## 2012-04-10 ENCOUNTER — Encounter: Payer: Self-pay | Admitting: Physician Assistant

## 2012-04-10 ENCOUNTER — Ambulatory Visit (INDEPENDENT_AMBULATORY_CARE_PROVIDER_SITE_OTHER): Payer: 59 | Admitting: Physician Assistant

## 2012-04-10 VITALS — BP 110/69 | HR 65 | Ht 73.0 in | Wt 153.8 lb

## 2012-04-10 DIAGNOSIS — Z720 Tobacco use: Secondary | ICD-10-CM | POA: Insufficient documentation

## 2012-04-10 DIAGNOSIS — E785 Hyperlipidemia, unspecified: Secondary | ICD-10-CM | POA: Insufficient documentation

## 2012-04-10 DIAGNOSIS — F17200 Nicotine dependence, unspecified, uncomplicated: Secondary | ICD-10-CM

## 2012-04-10 DIAGNOSIS — I10 Essential (primary) hypertension: Secondary | ICD-10-CM | POA: Insufficient documentation

## 2012-04-10 DIAGNOSIS — Z951 Presence of aortocoronary bypass graft: Secondary | ICD-10-CM

## 2012-04-10 DIAGNOSIS — I2510 Atherosclerotic heart disease of native coronary artery without angina pectoris: Secondary | ICD-10-CM

## 2012-04-10 MED ORDER — METOPROLOL TARTRATE 12.5 MG HALF TABLET: 12.5000 mg | ORAL_TABLET | Freq: Two times a day (BID) | ORAL | Status: DC

## 2012-04-10 MED ORDER — METOPROLOL SUCCINATE ER 25 MG PO TB24: 25.0000 mg | ORAL_TABLET | Freq: Every day | ORAL | Status: DC

## 2012-04-10 NOTE — Assessment & Plan Note (Signed)
Since discontinued

## 2012-04-10 NOTE — Assessment & Plan Note (Signed)
Continue current medication regimen. Will consolidate Lopressor to Toprol-XL 25 mg Lundquist, for regimen simplification. Continue full dose ASA for one year, then 81 mg indefinitely. Return clinic visit in 3 months, to establish with Dr. Antoine Poche.

## 2012-04-10 NOTE — Assessment & Plan Note (Signed)
Continue full dose Lipitor. Reassess lipid status in 12 weeks. Target LDL 70 or less, if feasible.

## 2012-04-10 NOTE — Progress Notes (Signed)
Primary Cardiologist: Rollene Rotunda, MD   HPI: Post hospital followup from [facility], status post NSTEMI and subsequent 7 vessel CABG, October 2, by Dr. Tyrone Sage.  Patient was transferred directly from [facility] ED, following presentation with CP. He presented with no known history of CAD. Initial troponin 1.4.   - Severe three vessel CAD with complex LAD bifurcation disease; EF 45-50%, mild global hypokinesis  Postop course benign, with no documented dysrhythmia.   Clinically, patient has been doing extremely well, ambulating twice [place] and tolerating his medications. He has not had any recurrent left upper chest CP or symptoms of food "stuck in throat", which were his presenting symptoms. He has also since quit smoking tobacco.   12-lead EKG today, reviewed by me, indicates NSR 68 bpm; symmetric T wave inversion anterolaterally.     Allergies  Allergen Reactions  . Other Swelling    peas    Current Outpatient Prescriptions  Medication Sig Dispense Refill  . aspirin EC 325 MG EC tablet Take 1 tablet (325 mg total) by mouth T[place]reau.  30 tablet    . atorvastatin (LIPITOR) 80 MG tablet Take 1 tablet (80 mg total) by mouth Sanjose.  30 tablet  1  . lisinopril (PRINIVIL,ZESTRIL) 2.5 MG tablet Take 1 tablet (2.5 mg total) by mouth Epple.  30 tablet  1  . metoprolol tartrate (LOPRESSOR) 12.5 mg TABS Take 0.5 tablets (12.5 mg total) by mouth 2 (two) times Brayman.  30 tablet  1  . oxyCODONE (OXY IR/ROXICODONE) 5 MG immediate release tablet Take 1-2 tablets (5-10 mg total) by mouth every 3 (three) hours as needed for pain.  30 tablet  0    Past Medical History  Diagnosis Date  . 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 Pneumothorax   . CAD (coronary artery disease)     NSTEMI/7 vessel CABG, 03/2012  . Tobacco abuse   . HLD (hyperlipidemia)   . HTN (hypertension)     Past Surgical History  Procedure Date  . Thoracotomy     about 10 yrs ago  . Tonsillectomy   . Coronary artery  bypass graft 03/27/2012    Procedure: CORONARY ARTERY BYPASS GRAFTING (CABG);  Surgeon: Delight Ovens, MD;  Location: [facility] OR;  Service: Open Heart Surgery;  Laterality: N/A;  Coronary Artery Bypass Grafting X 7 Using Left Internal Mammary Artery and Right Saphenous Leg Vein Harvested Endoscopically    History   Social History  . Marital Status: Married    Spouse Name: N/A    Number of Children: N/A  . Years of Education: N/A   Occupational History  . Not on file.   Social History Main Topics  . Smoking status: Current Every Day Smoker -- 1.5 packs/day for 30 years    Types: Cigarettes  . Smokeless tobacco: Never Used  . Alcohol Use: No  . Drug Use: No  . Sexually Active: Yes    Birth Control/ Protection: None   Other Topics Concern  . Not on file   Social History Narrative  . No narrative on file    Family History  Problem Relation Age of Onset  . Heart disease Other     ROS: no nausea, vomiting; no fever, chills; no melena, hematochezia; no claudication  PHYSICAL EXAM: BP 110/69  Pulse 65  Ht 6\' 1"  (1.854 m)  Wt 153 lb 12.8 oz (69.763 kg)  BMI 20.29 kg/m2  SpO2 98% GENERAL: 56 year-old male; NAD HEENT: NCAT, PERRLA, EOMI; sclera clear; no xanthelasma  NECK: palpable bilateral carotid pulses, no bruits; no JVD; no TM LUNGS: Markedly diminished BS, but no crackles/wheezes CARDIAC: RRR (S1, S2); no significant murmurs; no rubs or gallops ABDOMEN: soft, non-tender; intact BS EXTREMETIES: intact distal pulses; no significant peripheral edema SKIN: Well-healed midline incision, with minimal peri-incisional erythema; no obvious oozing MUSCULOSKELETAL: no joint deformity NEURO: no focal deficit; NL affect   EKG: reviewed and available in Electronic Records   ASSESSMENT & PLAN:  CAD (coronary artery disease) Continue current medication regimen. Will consolidate Lopressor to Toprol-XL 25 mg Winslow, for regimen simplification. Continue full dose ASA for one year,  then 81 mg indefinitely. Return clinic visit in 3 months, to establish with Dr. Antoine Poche.  HLD (hyperlipidemia) Continue full dose Lipitor. Reassess lipid status in 12 weeks. Target LDL 70 or less, if feasible.  HTN (hypertension) Well-controlled on current medication regimen  Tobacco abuse Since discontinued    Gene Liyla Radliff, PAC

## 2012-04-10 NOTE — Patient Instructions (Addendum)
   Labs - due around July 11, 2012 - fasting lipid and liver panel (orders given today)  Lab - today - BMET   Office will contact with results  After finishing current supply on Lopressor, begin Toprol XL 25mg  Mccue Continue all other current medications. Follow up in  3 months

## 2012-04-10 NOTE — Assessment & Plan Note (Signed)
Well-controlled on current medication regimen 

## 2012-04-12 ENCOUNTER — Encounter: Payer: Self-pay | Admitting: *Deleted

## 2012-04-24 ENCOUNTER — Other Ambulatory Visit: Payer: Self-pay | Admitting: Cardiothoracic Surgery

## 2012-04-24 DIAGNOSIS — Z951 Presence of aortocoronary bypass graft: Secondary | ICD-10-CM

## 2012-04-25 ENCOUNTER — Ambulatory Visit
Admission: RE | Admit: 2012-04-25 | Discharge: 2012-04-25 | Disposition: A | Discharge status: Home / Self Care | Payer: 59 | Source: Ambulatory Visit | Attending: Cardiothoracic Surgery | Admitting: Cardiothoracic Surgery

## 2012-04-25 ENCOUNTER — Encounter: Payer: Self-pay | Admitting: Cardiothoracic Surgery

## 2012-04-25 ENCOUNTER — Ambulatory Visit (INDEPENDENT_AMBULATORY_CARE_PROVIDER_SITE_OTHER): Payer: Self-pay | Admitting: Cardiothoracic Surgery

## 2012-04-25 VITALS — BP 102/72 | HR 72 | Resp 18 | Ht 73.5 in | Wt 153.0 lb

## 2012-04-25 DIAGNOSIS — Z951 Presence of aortocoronary bypass graft: Secondary | ICD-10-CM

## 2012-04-25 DIAGNOSIS — Z720 Tobacco use: Secondary | ICD-10-CM

## 2012-04-25 DIAGNOSIS — F17200 Nicotine dependence, unspecified, uncomplicated: Secondary | ICD-10-CM

## 2012-04-25 DIAGNOSIS — I2510 Atherosclerotic heart disease of native coronary artery without angina pectoris: Secondary | ICD-10-CM

## 2012-04-25 NOTE — Patient Instructions (Signed)
May drive Great you are not smoking, continue  No lifting over 25 lbs for 3 months May return to light duty, no lifting    Coronary Artery Bypass Grafting  Care After  Refer to this sheet in the next few weeks. These instructions provide you with information on caring for yourself after your procedure. Your caregiver may also give you more specific instructions. Your treatment has been planned according to current medical practices, but problems sometimes occur. Call your caregiver if you have any problems or questions after your procedure.  Recovery from open heart surgery will be different for everyone. Some people feel well after 3 or 4 weeks, while for others it takes longer. After heart surgery, it may be normal to:  Not have an appetite, feel nauseated by the smell of food, or only want to eat a small amount.   Be constipated because of changes in your diet, activity, and medicines. Eat foods high in fiber. Add fresh fruits and vegetables to your diet. Stool softeners may be helpful.   Feel sad or unhappy. You may be frustrated or cranky. You may have good days and bad days. Do not give up. Talk to your caregiver if you do not feel better.   Feel weakness and fatigue. You many need physical therapy or cardiac rehabilitation to get your strength back.   Develop an irregular heartbeat called atrial fibrillation. Symptoms of atrial fibrillation are a fast, irregular heartbeat or feelings of fluttery heartbeats, shortness of breath, low blood pressure, and dizziness. If these symptoms develop, see your caregiver right away.  MEDICATION  Have a list of all the medicines you will be taking when you leave the hospital. For every medicine, know the following:   Name.   Exact dose.   Time of day to be taken.   How often it should be taken.   Why you are taking it.   Ask which medicines should or should not be taken together. If you take more than one heart medicine, ask if it is  okay to take them together. Some heart medicines should not be taken at the same time because they may lower your blood pressure too much.   Narcotic pain medicine can cause constipation. Eat fresh fruits and vegetables. Add fiber to your diet. Stool softener medicine may help relieve constipation.   Keep a copy of your medicines with you at all times.   Do not add or stop taking any medicine until you check with your caregiver.   Medicines can have side effects. Call your caregiver who prescribed the medicine if you:   Start throwing up, have diarrhea, or have stomach pain.   Feel dizzy or lightheaded when you stand up.   Feel your heart is skipping beats or is beating too fast or too slow.   Develop a rash.   Notice unusual bruising or bleeding.  HOME CARE INSTRUCTIONS  After heart surgery, it is important to learn how to take your pulse. Have your caregiver show you how to take your pulse.   Use your incentive spirometer. Ask your caregiver how long after surgery you need to use it.  Care of your chest incision  Tell your caregiver right away if you notice clicking in your chest (sternum).   Support your chest with a pillow or your arms when you take deep breaths and cough.   Follow your caregiver's instructions about when you can bathe or swim.   Protect your incision from sunlight during  the first year to keep the scar from getting dark.   Tell your caregiver if you notice:   Increased tenderness of your incision.   Increased redness or swelling around your incision.   Drainage or pus from your incision.  Care of your leg incision(s)  Avoid crossing your legs.   Avoid sitting for long periods of time. Change positions every half hour.   Elevate your leg(s) when you are sitting.   Check your leg(s) [place] for swelling. Check the incisions for redness or drainage.   Diet is very important to heart health.   Eat plenty of fresh fruits and vegetables. Meats should  be lean cut. Avoid canned, processed, and fried foods.   Talk to a dietician. They can teach you how to make healthy food and drink choices.  Weight  Weigh yourself every day. This is important because it helps to know if you are retaining fluid that may make your heart and lungs work harder.   Use the same scale each time.   Weigh yourself every morning at the same time. You should do this after you go to the bathroom, but before you eat breakfast.   Your weight will be more accurate if you do not wear any clothes.   Record your weight.   Tell your caregiver if you have gained 2 pounds or more overnight.  Activity Stop any activity at once if you have chest pain, shortness of breath, irregular heartbeats, or dizziness. Get help right away if you have any of these symptoms.  Bathing.  Avoid soaking in a bath or hot tub until your incisions are healed.   Rest. You need a balance of rest and activity.   Exercise. Exercise per your caregiver's advice. You may need physical therapy or cardiac rehabilitation to help strengthen your muscles and build your endurance.   Climbing stairs. Unless your caregiver tells you not to climb stairs, go up stairs slowly and rest if you tire. Do not pull yourself up by the handrail.   Driving a car. Follow your caregiver's advice on when you may drive. You may ride as a passenger at any time. When traveling for long periods of time in a car, get out of the car and walk around for a few minutes every 2 hours.   Lifting. Avoid lifting, pushing, or pulling anything heavier than 10 pounds for 6 weeks after surgery or as told by your caregiver.   Returning to work. Check with your caregiver. People heal at different rates. Most people will be able to go back to work 6 to 12 weeks after surgery.   Sexual activity. You may resume sexual relations as told by your caregiver.  SEEK MEDICAL CARE IF:  Any of your incisions are red, painful, or have any type of  drainage coming from them.   You have an oral temperature above 101.5 F .   You have ankle or leg swelling.   You have pain in your legs.   You have weight gain of 2 or more pounds a day.   You feel dizzy or lightheaded when you stand up.  SEEK IMMEDIATE MEDICAL CARE IF:  You have angina or chest pain that goes to your jaw or arms. Call your local emergency services right away.   You have shortness of breath at rest or with activity.   You have a fast or irregular heartbeat (arrhythmia).   There is a "clicking" in your sternum when you move.  You have numbness or weakness in your arms or legs.  MAKE SURE YOU:  Understand these instructions.   Will watch your condition.   Will get help right away if you are not doing well or get worse.

## 2012-04-25 NOTE — Progress Notes (Signed)
[address]            [place] [address]          [phone]       Shawn Carey [place] Medical Record #[id number] Date of Birth: Dec 23, 1955  Shawn Abraham, MD Shawn Carey., MD  Chief Complaint:   PostOp Follow Up Visit 03/27/2012  OPERATIVE REPORT  PREOPERATIVE DIAGNOSIS: Three-vessel coronary occlusive disease with  recent non-ST elevation myocardial infarction.  POSTOPERATIVE DIAGNOSIS: Three-vessel coronary occlusive disease with  recent non-ST elevation myocardial infarction.  SURGICAL PROCEDURE: Coronary artery bypass grafting x7 with the left  internal mammary to the dominant diagonal coronary artery, sequential  reverse saphenous vein graft to the distal branches of the right fifth  left anterior descending, sequential reverse saphenous vein graft to the  first obtuse marginal and distal circumflex, sequential reverse  saphenous vein graft to the posterior descending and posterolateral  branches of the right coronary artery with right leg endo vein   History of Present Illness:     Not smoking since surgery, no chest pain, or chocking feeling.          History  Smoking status  . Current Every Day Smoker -- 1.5 packs/day for 30 years  . Types: Cigarettes  Smokeless tobacco  . Never Used       Allergies  Allergen Reactions  . Other Swelling    peas    Current Outpatient Prescriptions  Medication Sig Dispense Refill  . aspirin EC 325 MG EC tablet Take 1 tablet (325 mg total) by mouth Blunck.  30 tablet    . atorvastatin (LIPITOR) 80 MG tablet Take 1 tablet (80 mg total) by mouth Townshend.  30 tablet  1  . lisinopril (PRINIVIL,ZESTRIL) 2.5 MG tablet Take 1 tablet (2.5 mg total) by mouth Veal.  30 tablet  1  . metoprolol succinate (TOPROL-XL) 25 MG 24 hr tablet Take 1 tablet (25 mg total) by mouth Besaw.  30 tablet  6  . oxyCODONE (OXY IR/ROXICODONE) 5 MG immediate release tablet Take 1-2 tablets (5-10 mg  total) by mouth every 3 (three) hours as needed for pain.  30 tablet  0       Physical Exam: BP 102/72  Pulse 72  Resp 18  Ht 6' 1.5" (1.867 m)  Wt 153 lb (69.4 kg)  BMI 19.91 kg/m2  SpO2 98%  General appearance: alert, cooperative and no distress Neurologic: intact Heart: regular rate and rhythm, S1, S2 normal, no murmur, click, rub or gallop and normal apical impulse Lungs: clear to auscultation bilaterally and normal percussion bilaterally Abdomen: soft, non-tender; bowel sounds normal; no masses,  no organomegaly Extremities: extremities normal, atraumatic, no cyanosis or edema and Homans sign is negative, no sign of DVT Wound: sternum stable, rt leg well healed Wounds:  Diagnostic Studies & Laboratory data:         Recent Radiology Findings: Dg Chest 2 View  04/25/2012  *RADIOLOGY REPORT*  Clinical Data: Postop CABG.  CHEST - 2 VIEW  Comparison: 03/30/2012  Findings: Interval resolution of the left apical pneumothorax. Biapical scarring and underlying COPD.  Heart is normal size.  No acute opacities.  Trace left effusion, improved since prior study.  IMPRESSION: Interval resolution of left pneumothorax.  Decreasing left effusion.   Original Report Authenticated By: Shawn Carey, M.D.       Recent Labs: Lab  Results  Component Value Date   WBC 10.9* 03/30/2012   HGB 11.2* 03/30/2012   HCT 33.5* 03/30/2012   PLT 155 03/30/2012   GLUCOSE 108* 03/30/2012   CHOL 246* 03/25/2012   TRIG 147 03/25/2012   HDL 40 03/25/2012   LDLCALC 177* 03/25/2012   ALT 14 03/26/2012   AST 26 03/26/2012   NA 134* 03/30/2012   K 3.8 03/30/2012   CL 99 03/30/2012   CREATININE 0.78 03/30/2012   BUN 16 03/30/2012   CO2 28 03/30/2012   INR 1.33 03/27/2012   HGBA1C 5.5 03/27/2012      Assessment / Plan:   Doing well post op Not smoking Continues on lipid management medication, preop chol 246, LDL calculated 177, Will need continued follow up        Shawn Carey B 04/25/2012 9:40  AM

## 2012-04-26 ENCOUNTER — Other Ambulatory Visit: Payer: Self-pay | Admitting: *Deleted

## 2012-04-26 DIAGNOSIS — G8918 Other acute postprocedural pain: Secondary | ICD-10-CM

## 2012-04-26 MED ORDER — HYDROCODONE-ACETAMINOPHEN 7.5-500 MG PO TABS: 1.0000 | ORAL_TABLET | Freq: Four times a day (QID) | ORAL | Status: DC | PRN

## 2012-05-27 ENCOUNTER — Other Ambulatory Visit: Payer: Self-pay | Admitting: Cardiology

## 2012-05-27 ENCOUNTER — Other Ambulatory Visit: Payer: Self-pay | Admitting: Physician Assistant

## 2012-05-27 MED ORDER — ATORVASTATIN CALCIUM 80 MG PO TABS: 80.0000 mg | ORAL_TABLET | Freq: Every day | ORAL | Status: DC

## 2012-05-27 MED ORDER — LISINOPRIL 2.5 MG PO TABS: 2.5000 mg | ORAL_TABLET | Freq: Every day | ORAL | Status: DC

## 2012-07-01 ENCOUNTER — Encounter: Payer: Self-pay | Admitting: Cardiology

## 2012-07-01 ENCOUNTER — Encounter: Payer: Self-pay | Admitting: *Deleted

## 2012-07-01 ENCOUNTER — Ambulatory Visit (INDEPENDENT_AMBULATORY_CARE_PROVIDER_SITE_OTHER): Payer: 59 | Admitting: Cardiology

## 2012-07-01 VITALS — BP 124/76 | HR 70 | Ht 73.0 in | Wt 178.0 lb

## 2012-07-01 DIAGNOSIS — I2510 Atherosclerotic heart disease of native coronary artery without angina pectoris: Secondary | ICD-10-CM

## 2012-07-01 DIAGNOSIS — I10 Essential (primary) hypertension: Secondary | ICD-10-CM

## 2012-07-01 DIAGNOSIS — E785 Hyperlipidemia, unspecified: Secondary | ICD-10-CM

## 2012-07-01 NOTE — Progress Notes (Signed)
   HPI The patient presents for followup of coronary disease status post bypass surgery. Since he was last seen he has done well. He does still have some discomfort particularly on the left side of his chest. There is a point of tenderness to palpation. However, he's not having any of the discomfort that was his previous angina. He is working and does a lot of walking at work very he denies any chest pressure, neck or arm discomfort. He has no shortness of breath, PND or orthopnea. He has no weight gain or edema. He might get dyspneic with significant exertion such as carrying wood.  Allergies  Allergen Reactions  . Other Swelling    peas    Current Outpatient Prescriptions  Medication Sig Dispense Refill  . aspirin EC 325 MG EC tablet Take 1 tablet (325 mg total) by mouth Kinnison.  30 tablet    . atorvastatin (LIPITOR) 80 MG tablet Take 1 tablet (80 mg total) by mouth Suchocki.  30 tablet  6  . lisinopril (PRINIVIL,ZESTRIL) 2.5 MG tablet Take 1 tablet (2.5 mg total) by mouth Nason.  30 tablet  6  . metoprolol succinate (TOPROL-XL) 25 MG 24 hr tablet Take 1 tablet (25 mg total) by mouth Konczal.  30 tablet  6    Past Medical History  Diagnosis Date  . 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 Pneumothorax   . CAD (coronary artery disease)     NSTEMI/7 vessel CABG, 03/2012  . Tobacco abuse   . HLD (hyperlipidemia)   . HTN (hypertension)     Past Surgical History  Procedure Date  . Thoracotomy     about 10 yrs ago  . Tonsillectomy   . Coronary artery bypass graft 03/27/2012    Procedure: CORONARY ARTERY BYPASS GRAFTING (CABG);  Surgeon: Delight Ovens, MD;  Location: [facility] OR;  Service: Open Heart Surgery;  Laterality: N/A;  Coronary Artery Bypass Grafting X 7 Using Left Internal Mammary Artery and Right Saphenous Leg Vein Harvested Endoscopically    ROS:  As stated in the HPI and negative for all other systems.  PHYSICAL EXAM BP 124/76  Pulse 70  Ht 6\' 1"  (1.854 m)  Wt  178 lb (80.74 kg)  BMI 23.48 kg/m2  SpO2 97% GENERAL:  Well appearing NECK:  No jugular venous distention, waveform within normal limits, carotid upstroke brisk and symmetric, no bruits, no thyromegaly LYMPHATICS:  No cervical, inguinal adenopathy LUNGS:  Clear to auscultation bilaterally BACK:  No CVA tenderness CHEST:  Well healed sternotomy scar no evidence of sternal mobility. HEART:  PMI not displaced or sustained,S1 and S2 within normal limits, no S3, no S4, no clicks, no rubs, no murmurs ABD:  Flat, positive bowel sounds normal in frequency in pitch, no bruits, no rebound, no guarding, no midline pulsatile mass, no hepatomegaly, no splenomegaly EXT:  2 plus pulses throughout, no edema, no cyanosis no clubbing   ASSESSMENT AND PLAN  CAD (coronary artery disease)  He was switched to 81 mg aspirin. Otherwise he will continue on meds as listed. I have suggested that he remain on light duty since he is getting some sternal discomfort still.  HLD (hyperlipidemia)  He is on a target dose of Lipitor. He will continue with this.  HTN (hypertension)  Well-controlled on current medication regimen   Tobacco abuse  He is no longer smoking but he is using electronic cigarettes. We discussed the potential risk to these and I have suggested weaning himself off.

## 2012-07-01 NOTE — Patient Instructions (Addendum)
Your physician recommends that you schedule a follow-up appointment in: 6 months. You will receive a reminder letter in the mail in about 4 months reminding you to call and schedule your appointment. If you don't receive this letter, please contact our office. Your physician has recommended you make the following change in your medication: Decrease aspirin to 81 mg [place]. All other medications will remain the same. 

## 2012-07-08 DIAGNOSIS — Z0271 Encounter for disability determination: Secondary | ICD-10-CM

## 2012-08-10 ENCOUNTER — Other Ambulatory Visit: Payer: Self-pay

## 2012-11-25 ENCOUNTER — Other Ambulatory Visit: Payer: Self-pay | Admitting: Physician Assistant

## 2012-11-26 ENCOUNTER — Other Ambulatory Visit: Payer: Self-pay | Admitting: *Deleted

## 2012-11-26 MED ORDER — ATORVASTATIN CALCIUM 80 MG PO TABS: 80.0000 mg | ORAL_TABLET | Freq: Every day | ORAL | Status: DC

## 2012-11-26 MED ORDER — LISINOPRIL 2.5 MG PO TABS: 2.5000 mg | ORAL_TABLET | Freq: Every day | ORAL | Status: DC

## 2013-04-07 ENCOUNTER — Other Ambulatory Visit: Payer: Self-pay | Admitting: *Deleted

## 2013-04-07 MED ORDER — METOPROLOL SUCCINATE ER 25 MG PO TB24: 25.0000 mg | ORAL_TABLET | Freq: Every day | ORAL | Status: DC

## 2013-04-10 ENCOUNTER — Encounter: Payer: Self-pay | Admitting: Cardiovascular Disease

## 2013-04-10 ENCOUNTER — Encounter: Payer: Self-pay | Admitting: *Deleted

## 2013-04-10 ENCOUNTER — Ambulatory Visit (INDEPENDENT_AMBULATORY_CARE_PROVIDER_SITE_OTHER): Payer: 59 | Admitting: Cardiovascular Disease

## 2013-04-10 VITALS — BP 123/80 | HR 61 | Ht 73.0 in | Wt 184.0 lb

## 2013-04-10 DIAGNOSIS — I2510 Atherosclerotic heart disease of native coronary artery without angina pectoris: Secondary | ICD-10-CM

## 2013-04-10 DIAGNOSIS — E785 Hyperlipidemia, unspecified: Secondary | ICD-10-CM

## 2013-04-10 DIAGNOSIS — I10 Essential (primary) hypertension: Secondary | ICD-10-CM

## 2013-04-10 DIAGNOSIS — Z951 Presence of aortocoronary bypass graft: Secondary | ICD-10-CM

## 2013-04-10 NOTE — Progress Notes (Signed)
Patient ID: Shawn Carey, male   DOB: December 10, 1955, 57 y.o.   MRN: [id number]      SUBJECTIVE: Shawn Carey has a h/o 7-v CABG in October 2013, as well as HTN and hyperlipidemia. He denies chest pain similar to what he experienced prior to his CABG. He has had some mild sternal discomfort since his surgery, particularly on the lower aspect of it. He denies exertional dyspnea unless he heavily exerts himself. He denies palpitations, lightheadedness, and leg swelling. He is tolerating his meds without difficulty.  He smokes electronic cigarettes. He would like to go back to working at full capacity.    Allergies  Allergen Reactions  . Other Swelling    peas    Current Outpatient Prescriptions  Medication Sig Dispense Refill  . aspirin EC 81 MG tablet Take 81 mg by mouth Duman.      Marland Kitchen atorvastatin (LIPITOR) 80 MG tablet Take 1 tablet (80 mg total) by mouth Parlow.  30 tablet  6  . lisinopril (PRINIVIL,ZESTRIL) 2.5 MG tablet Take 1 tablet (2.5 mg total) by mouth Zaragosa.  30 tablet  6  . metoprolol succinate (TOPROL-XL) 25 MG 24 hr tablet Take 1 tablet (25 mg total) by mouth Shor.  30 tablet  1   No current facility-administered medications for this visit.    Past Medical History  Diagnosis Date  . 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 Pneumothorax   . CAD (coronary artery disease)     NSTEMI/7 vessel CABG, 03/2012  . Tobacco abuse   . HLD (hyperlipidemia)   . HTN (hypertension)     Past Surgical History  Procedure Laterality Date  . Thoracotomy      about 10 yrs ago  . Tonsillectomy    . Coronary artery bypass graft  03/27/2012    Procedure: CORONARY ARTERY BYPASS GRAFTING (CABG);  Surgeon: Delight Ovens, MD;  Location: [facility] OR;  Service: Open Heart Surgery;  Laterality: N/A;  Coronary Artery Bypass Grafting X 7 Using Left Internal Mammary Artery and Right Saphenous Leg Vein Harvested Endoscopically    History   Social History  . Marital Status: Married   Spouse Name: N/A    Number of Children: N/A  . Years of Education: N/A   Occupational History  . Not on file.   Social History Main Topics  . Smoking status: Former Smoker -- 1.50 packs/day for 30 years    Types: Cigarettes    Start date: 06/27/1975    Quit date: 03/24/2012  . Smokeless tobacco: Never Used  . Alcohol Use: No  . Drug Use: No  . Sexual Activity: Yes    Birth Control/ Protection: None   Other Topics Concern  . Not on file   Social History Narrative  . No narrative on file     Filed Vitals:   04/10/13 1346  BP: 123/80  Pulse: 61  Height: 6\' 1"  (1.854 m)  Weight: 184 lb (83.462 kg)    PHYSICAL EXAM General: NAD Neck: No JVD, no thyromegaly or thyroid nodule.  Lungs: Clear to auscultation bilaterally with normal respiratory effort. CV: Nondisplaced PMI.  Well-healed midline sternotomy incision. Heart regular S1/S2, no S3/S4, no murmur.  No peripheral edema.  No carotid bruit.  Normal pedal pulses.  Abdomen: Soft, nontender, no hepatosplenomegaly, no distention.  Neurologic: Alert and oriented x 3.  Psych: Normal affect. Extremities: No clubbing or cyanosis.   ECG: reviewed and available in electronic records. (NSR, 60 bpm)  ASSESSMENT AND PLAN:  CAD (coronary artery disease)  He will continue on meds as listed. He will return to work in full capacity.  HLD (hyperlipidemia)  He is on a target dose of Lipitor. He will continue with this.   HTN (hypertension)  Well-controlled on current medication regimen   Tobacco abuse  He is no longer smoking but he is using electronic cigarettes. He wants to continue using them.        Prentice Docker, M.D., F.A.C.C.

## 2013-04-10 NOTE — Patient Instructions (Signed)

## 2013-05-01 ENCOUNTER — Other Ambulatory Visit: Payer: Self-pay

## 2013-06-09 ENCOUNTER — Telehealth: Payer: Self-pay

## 2013-06-09 MED ORDER — METOPROLOL SUCCINATE ER 25 MG PO TB24: 25.0000 mg | ORAL_TABLET | Freq: Every day | ORAL | Status: DC

## 2013-06-09 NOTE — Telephone Encounter (Signed)
Patient wife states that refill requested on Friday for today.  Needs refill of metoprolol succinate (TOPROL-XL) 25 MG 24 hr tablet

## 2013-08-13 ENCOUNTER — Other Ambulatory Visit: Payer: Self-pay | Admitting: *Deleted

## 2013-08-13 ENCOUNTER — Other Ambulatory Visit: Payer: Self-pay | Admitting: Cardiology

## 2013-08-13 MED ORDER — LISINOPRIL 2.5 MG PO TABS: 2.5000 mg | ORAL_TABLET | Freq: Every day | ORAL | Status: DC

## 2013-08-13 MED ORDER — ATORVASTATIN CALCIUM 80 MG PO TABS: 80.0000 mg | ORAL_TABLET | Freq: Every day | ORAL | Status: DC

## 2013-08-13 NOTE — Telephone Encounter (Signed)
Pt wife called and requested medication refill for lisinopril and Lipitor be sent to [facility] in [place].

## 2013-09-25 ENCOUNTER — Ambulatory Visit: Payer: 59 | Admitting: Cardiovascular Disease

## 2013-10-01 ENCOUNTER — Encounter: Payer: Self-pay | Admitting: Cardiovascular Disease

## 2013-10-01 ENCOUNTER — Ambulatory Visit (INDEPENDENT_AMBULATORY_CARE_PROVIDER_SITE_OTHER): Payer: 59 | Admitting: Cardiovascular Disease

## 2013-10-01 VITALS — BP 127/76 | HR 66 | Ht 73.5 in | Wt 191.0 lb

## 2013-10-01 DIAGNOSIS — E785 Hyperlipidemia, unspecified: Secondary | ICD-10-CM

## 2013-10-01 DIAGNOSIS — M25473 Effusion, unspecified ankle: Secondary | ICD-10-CM

## 2013-10-01 DIAGNOSIS — I2510 Atherosclerotic heart disease of native coronary artery without angina pectoris: Secondary | ICD-10-CM

## 2013-10-01 DIAGNOSIS — R252 Cramp and spasm: Secondary | ICD-10-CM

## 2013-10-01 DIAGNOSIS — I10 Essential (primary) hypertension: Secondary | ICD-10-CM

## 2013-10-01 DIAGNOSIS — Z951 Presence of aortocoronary bypass graft: Secondary | ICD-10-CM

## 2013-10-01 DIAGNOSIS — M25476 Effusion, unspecified foot: Secondary | ICD-10-CM

## 2013-10-01 DIAGNOSIS — M2540 Effusion, unspecified joint: Secondary | ICD-10-CM

## 2013-10-01 NOTE — Patient Instructions (Signed)
Your physician has requested that you have an ankle brachial index (ABI). During this test an ultrasound and blood pressure cuff are used to evaluate the arteries that supply the arms and legs with blood. Allow thirty minutes for this exam. There are no restrictions or special instructions. Lab for uric acid Office will contact with results via phone or letter.   Continue all current medications. Your physician wants you to follow up in: 6 months.  You will receive a reminder letter in the mail one-two months in advance.  If you don't receive a letter, please call our office to schedule the follow up appointment

## 2013-10-01 NOTE — Progress Notes (Signed)
Patient ID: Shawn Carey, male   DOB: 04/04/1956, 58 y.o.   MRN: [id number]      SUBJECTIVE: Mr. Kopper has a h/o 7-v CABG in October 2013, as well as HTN and hyperlipidemia. He denies exertional chest discomfort, but does continue to have some mild soreness at the lower part of the incisional scar. He has been having right foot swelling with swelling of the toes of the right foot. He has also had pain in the right calf with exertion. This has been going on for 6 weeks. He was previously told it was tendinitis. He denies swelling of the leg itself. He only has exertional dyspnea if he is lifting something heavy.     Allergies  Allergen Reactions  . Other Swelling    peas    Current Outpatient Prescriptions  Medication Sig Dispense Refill  . aspirin EC 81 MG tablet Take 81 mg by mouth Sgroi.      Marland Kitchen atorvastatin (LIPITOR) 80 MG tablet Take 1 tablet (80 mg total) by mouth Suares.  30 tablet  6  . lisinopril (PRINIVIL,ZESTRIL) 2.5 MG tablet Take 1 tablet (2.5 mg total) by mouth Astle.  30 tablet  6  . metoprolol succinate (TOPROL-XL) 25 MG 24 hr tablet Take 1 tablet (25 mg total) by mouth Hartmann.  30 tablet  6   No current facility-administered medications for this visit.    Past Medical History  Diagnosis Date  . 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 Pneumothorax   . CAD (coronary artery disease)     NSTEMI/7 vessel CABG, 03/2012  . Tobacco abuse   . HLD (hyperlipidemia)   . HTN (hypertension)     Past Surgical History  Procedure Laterality Date  . Thoracotomy      about 10 yrs ago  . Tonsillectomy    . Coronary artery bypass graft  03/27/2012    Procedure: CORONARY ARTERY BYPASS GRAFTING (CABG);  Surgeon: Grace Isaac, MD;  Location: [facility];  Service: Open Heart Surgery;  Laterality: N/A;  Coronary Artery Bypass Grafting X 7 Using Left Internal Mammary Artery and Right Saphenous Leg Vein Harvested Endoscopically    History   Social History  . Marital  Status: Married    Spouse Name: N/A    Number of Children: N/A  . Years of Education: N/A   Occupational History  . Not on file.   Social History Main Topics  . Smoking status: Former Smoker -- 1.50 packs/day for 30 years    Types: Cigarettes    Start date: 06/27/1975    Quit date: 03/24/2012  . Smokeless tobacco: Never Used  . Alcohol Use: No  . Drug Use: No  . Sexual Activity: Yes    Birth Control/ Protection: None   Other Topics Concern  . Not on file   Social History Narrative  . No narrative on file     Filed Vitals:   10/01/13 1507  BP: 127/76  Pulse: 66  Height: 6' 1.5" (1.867 m)  Weight: 191 lb (86.637 kg)  SpO2: 95%    PHYSICAL EXAM General: NAD Neck: No JVD, no thyromegaly. Lungs: Clear to auscultation bilaterally with normal respiratory effort. CV: Nondisplaced PMI.  Regular rate and rhythm, normal S1/S2, no S3/S4, no murmur. No pretibial or periankle edema.  No carotid bruit.  Slightly diminished pedal pulses in right foot. Abdomen: Soft, nontender, no hepatosplenomegaly, no distention.  Neurologic: Alert and oriented x 3.  Psych: Normal affect. Extremities: No clubbing or cyanosis.  Erythema and swelling of right toes.  ECG: reviewed and available in electronic records.      ASSESSMENT AND PLAN:  CAD (coronary artery disease)  He will continue on meds as listed.   HLD (hyperlipidemia)  He is on a target dose of Lipitor. He will continue with this.   HTN (hypertension)  Well-controlled on current medication regimen   Tobacco abuse  He is no longer smoking but he is using electronic cigarettes. He wants to continue using them.   Right foot/toes swelling and erythema Given his prior history of tobacco use, I will check bilateral ABIs. I will also check a serum urate level to help to rule out gout. He only drinks 1 beer a year.    Dispo: f/u 6 months.  Kate Sable, M.D., F.A.C.C.

## 2013-10-06 ENCOUNTER — Telehealth: Payer: Self-pay | Admitting: *Deleted

## 2013-10-06 NOTE — Telephone Encounter (Signed)
Message copied by [name] on Mon Oct 06, 2013  9:23 AM ------      Message from: [name]      Created: Thu Oct 02, 2013  3:50 PM                   ----- Message -----         From: Herminio Commons, MD         Sent: 10/02/2013   3:45 PM           To: [name], LPN            Normal. ------

## 2013-10-06 NOTE — Telephone Encounter (Signed)
Patient's wife informed

## 2013-10-08 ENCOUNTER — Ambulatory Visit (INDEPENDENT_AMBULATORY_CARE_PROVIDER_SITE_OTHER): Payer: 59 | Admitting: Cardiology

## 2013-10-08 DIAGNOSIS — R0989 Other specified symptoms and signs involving the circulatory and respiratory systems: Secondary | ICD-10-CM

## 2013-10-08 DIAGNOSIS — I70219 Atherosclerosis of native arteries of extremities with intermittent claudication, unspecified extremity: Secondary | ICD-10-CM

## 2013-10-09 ENCOUNTER — Encounter: Payer: Self-pay | Admitting: *Deleted

## 2014-01-21 ENCOUNTER — Telehealth: Payer: Self-pay | Admitting: Cardiovascular Disease

## 2014-01-21 NOTE — Telephone Encounter (Signed)
metoprolol succinate (TOPROL-XL) 25 MG 24 hr tablet  Needs refill sent to [facility] in [place].  [place] told her that they have been trying to contact us all week in reference to this RX

## 2014-01-22 ENCOUNTER — Other Ambulatory Visit: Payer: Self-pay | Admitting: *Deleted

## 2014-01-22 MED ORDER — METOPROLOL SUCCINATE ER 25 MG PO TB24: 25.0000 mg | ORAL_TABLET | Freq: Every day | ORAL | Status: DC

## 2014-06-04 ENCOUNTER — Encounter (HOSPITAL_COMMUNITY): Payer: Self-pay | Admitting: Cardiology

## 2014-08-05 ENCOUNTER — Other Ambulatory Visit: Payer: Self-pay | Admitting: Cardiovascular Disease

## 2014-08-05 MED ORDER — METOPROLOL SUCCINATE ER 25 MG PO TB24: 25.0000 mg | ORAL_TABLET | Freq: Every day | ORAL | Status: DC

## 2014-08-05 MED ORDER — ATORVASTATIN CALCIUM 80 MG PO TABS
80.0000 mg | ORAL_TABLET | Freq: Every day | ORAL | Status: DC
Start: 2014-08-05 — End: 2014-11-18

## 2014-08-05 NOTE — Telephone Encounter (Signed)
Needs refills on Toprol-XL & Lipitor [place] that [facility] has faxed request but no response.

## 2014-08-05 NOTE — Telephone Encounter (Signed)
Attempted to contact patient multiple times - fast busy signal.   Also, attempted to contact [facility].  Was on hold for 10 + minutes.   Our office has not received any communication from [facility].  Will go ahead & e-scribe refills today.   Patient also is over due for 6 month follow up, was due in October 2015.

## 2014-08-06 MED ORDER — LISINOPRIL 2.5 MG PO TABS: 2.5000 mg | ORAL_TABLET | Freq: Every day | ORAL | Status: DC

## 2014-08-06 NOTE — Telephone Encounter (Signed)
lisinopril (PRINIVIL,ZESTRIL) 2.5 MG tablet [employer]

## 2014-08-06 NOTE — Telephone Encounter (Signed)
Medication sent to pharmacy  

## 2014-11-18 ENCOUNTER — Other Ambulatory Visit: Payer: Self-pay | Admitting: Cardiovascular Disease

## 2014-11-18 MED ORDER — ATORVASTATIN CALCIUM 80 MG PO TABS: 80.0000 mg | ORAL_TABLET | Freq: Every day | ORAL | Status: DC

## 2014-11-18 NOTE — Telephone Encounter (Signed)
atorvastatin (LIPITOR) 80 MG tablet  [facility] in [place]

## 2014-12-01 ENCOUNTER — Other Ambulatory Visit: Payer: Self-pay | Admitting: Cardiovascular Disease

## 2014-12-01 MED ORDER — ATORVASTATIN CALCIUM 80 MG PO TABS: 80.0000 mg | ORAL_TABLET | Freq: Every day | ORAL | Status: DC

## 2014-12-01 NOTE — Telephone Encounter (Signed)
Needs refill on Lipitor [facility]

## 2014-12-14 ENCOUNTER — Ambulatory Visit (INDEPENDENT_AMBULATORY_CARE_PROVIDER_SITE_OTHER): Payer: 59 | Admitting: Cardiovascular Disease

## 2014-12-14 ENCOUNTER — Encounter: Payer: Self-pay | Admitting: Cardiovascular Disease

## 2014-12-14 VITALS — BP 109/70 | HR 62 | Ht 73.5 in | Wt 191.0 lb

## 2014-12-14 DIAGNOSIS — Z951 Presence of aortocoronary bypass graft: Secondary | ICD-10-CM | POA: Diagnosis not present

## 2014-12-14 DIAGNOSIS — I2510 Atherosclerotic heart disease of native coronary artery without angina pectoris: Secondary | ICD-10-CM

## 2014-12-14 DIAGNOSIS — R9431 Abnormal electrocardiogram [ECG] [EKG]: Secondary | ICD-10-CM

## 2014-12-14 DIAGNOSIS — I10 Essential (primary) hypertension: Secondary | ICD-10-CM

## 2014-12-14 DIAGNOSIS — E785 Hyperlipidemia, unspecified: Secondary | ICD-10-CM | POA: Diagnosis not present

## 2014-12-14 NOTE — Patient Instructions (Signed)
   Stop Lisinopril Continue all other medications.   Lab for Lipids - Reminder:  Nothing to eat or drink after 12 midnight prior to labs. - order given today.  Office will contact with results via phone or letter.   Your physician wants you to follow up in:  1 year.  You will receive a reminder letter in the mail one-two months in advance.  If you don't receive a letter, please call our office to schedule the follow up appointment

## 2014-12-14 NOTE — Progress Notes (Signed)
Patient ID: Shawn Carey, male   DOB: [birth year], 59 y.o.   MRN: [id number]      SUBJECTIVE: Shawn Carey has a history of 7-v CABG in October 2013, as well as HTN and hyperlipidemia. He underwent normal ABIs in April 2015. He is feeling well and denies chest pain, palpitations, and leg swelling. He only gets short of breath if he overdoes it at work. He works at the [employer]. He would like to stop any medications if possible. He takes lisinopril 2.5 mg Aliberti with a blood pressure of 109/70.  ECG performed in the office today demonstrates normal sinus rhythm with T-wave inversions in leads III and aVF. These are new from 2014.  Review of Systems: As per "subjective", otherwise negative.  Allergies  Allergen Reactions  . Other Swelling    peas    Current Outpatient Prescriptions  Medication Sig Dispense Refill  . aspirin EC 81 MG tablet Take 81 mg by mouth Kasper.    Marland Kitchen atorvastatin (LIPITOR) 80 MG tablet Take 1 tablet (80 mg total) by mouth Gandolfo. 30 tablet 0  . lisinopril (PRINIVIL,ZESTRIL) 2.5 MG tablet Take 1 tablet (2.5 mg total) by mouth Schamberger. 30 tablet 6  . metoprolol succinate (TOPROL-XL) 25 MG 24 hr tablet Take 1 tablet (25 mg total) by mouth Cornick. 30 tablet 2   No current facility-administered medications for this visit.    Past Medical History  Diagnosis Date  . 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 Pneumothorax   . CAD (coronary artery disease)     NSTEMI/7 vessel CABG, 03/2012  . Tobacco abuse   . HLD (hyperlipidemia)   . HTN (hypertension)     Past Surgical History  Procedure Laterality Date  . Thoracotomy      about 10 yrs ago  . Tonsillectomy    . Coronary artery bypass graft  03/27/2012    Procedure: CORONARY ARTERY BYPASS GRAFTING (CABG);  Surgeon: Grace Isaac, MD;  Location: [place] [facility];  Service: Open Heart Surgery;  Laterality: N/A;  Coronary Artery Bypass Grafting X 7 Using Left Internal Mammary Artery and Right  Saphenous Leg Vein Harvested Endoscopically  . Left heart catheterization with coronary angiogram N/A 03/25/2012    Procedure: LEFT HEART CATHETERIZATION WITH CORONARY ANGIOGRAM;  Surgeon: Hillary Bow, MD;  Location: [facility] CATH LAB;  Service: Cardiovascular;  Laterality: N/A;    History   Social History  . Marital Status: Married    Spouse Name: N/A  . Number of Children: N/A  . Years of Education: N/A   Occupational History  . Not on file.   Social History Main Topics  . Smoking status: Former Smoker -- 1.50 packs/day for 30 years    Types: Cigarettes    Start date: 06/27/1975    Quit date: 03/24/2012  . Smokeless tobacco: Never Used     Comment: currently uses an electric cigarette-has zero nicotine  . Alcohol Use: No  . Drug Use: No  . Sexual Activity: Yes    Birth Control/ Protection: None   Other Topics Concern  . Not on file   Social History Narrative     Filed Vitals:   12/14/14 1134  BP: 109/70  Pulse: 62  Height: 6' 1.5" (1.867 m)  Weight: 191 lb (86.637 kg)    PHYSICAL EXAM General: NAD HEENT: Normal. Neck: No JVD, no thyromegaly. Lungs: Clear to auscultation bilaterally with normal respiratory effort. CV: Nondisplaced PMI.  Regular rate and rhythm, normal S1/S2, no S3/S4,  no murmur. No pretibial or periankle edema.  No carotid bruit.    Abdomen: Soft, nontender, no distention.  Neurologic: Alert and oriented x 3.  Psych: Normal affect. Skin: Normal. Musculoskeletal: No gross deformities. Extremities: No clubbing or cyanosis.   ECG: Most recent ECG reviewed.      ASSESSMENT AND PLAN: 1. CAD s/p CABG: Symptomatically sable ischemic heart disease. Continue aspirin, Toprol-XL 25 mg Carranza, and Lipitor 80 mg. Will discontinue lisinopril as per his request.  2. Essential HTN: Low normal. Will monitor given discontinuation of low-dose lisinopril.  3. Hyperlipidemia: Check lipid panel. Continue high intensity statin therapy with Lipitor 80  mg.  Dispo: f/u 1 year.   Kate Sable, M.D., F.A.C.C.

## 2014-12-22 ENCOUNTER — Other Ambulatory Visit: Payer: Self-pay | Admitting: Cardiovascular Disease

## 2014-12-22 ENCOUNTER — Other Ambulatory Visit: Payer: Self-pay

## 2014-12-22 MED ORDER — METOPROLOL SUCCINATE ER 25 MG PO TB24: 25.0000 mg | ORAL_TABLET | Freq: Every day | ORAL | Status: DC

## 2014-12-22 NOTE — Telephone Encounter (Signed)
Needs refill on Metoprolol sent to [facility] in [place] / tg

## 2014-12-22 NOTE — Telephone Encounter (Signed)
Refill complete 

## 2014-12-22 NOTE — Telephone Encounter (Signed)
Medication sent to pharmacy by [facility] staff

## 2014-12-25 ENCOUNTER — Encounter: Payer: Self-pay | Admitting: *Deleted

## 2015-01-04 ENCOUNTER — Telehealth: Payer: Self-pay | Admitting: *Deleted

## 2015-01-04 NOTE — Telephone Encounter (Signed)
-----   Message from Herminio Commons, MD sent at 12/31/2014  3:54 PM EDT ----- LDL elevated. Would add Zetia 10 mg Musselman.

## 2015-01-04 NOTE — Telephone Encounter (Signed)
Notes Recorded by Laurine Blazer, LPN on 0/37/9444 at 61:90 AM Home # - call unable to go through. Mobile # - Left message to return call.

## 2015-01-07 ENCOUNTER — Other Ambulatory Visit: Payer: Self-pay | Admitting: Cardiovascular Disease

## 2015-01-07 MED ORDER — EZETIMIBE 10 MG PO TABS: 10.0000 mg | ORAL_TABLET | Freq: Every day | ORAL | Status: DC

## 2015-01-07 MED ORDER — ATORVASTATIN CALCIUM 80 MG PO TABS: 80.0000 mg | ORAL_TABLET | Freq: Every day | ORAL | Status: DC

## 2015-01-07 NOTE — Telephone Encounter (Signed)
atorvastatin (LIPITOR) 80 MG tablet [facility] [place]

## 2015-01-07 NOTE — Telephone Encounter (Signed)
Notes Recorded by Laurine Blazer, LPN on 6/38/4536 at 4:68 PM Home # - call unable to go through. Mobile # - Left message to return call.

## 2015-01-07 NOTE — Telephone Encounter (Signed)
Notes Recorded by Laurine Blazer, LPN on 9/61/1643 at 5:39 PM Patient notified. Will send new prescription to [facility].

## 2015-01-07 NOTE — Telephone Encounter (Signed)
Medication sent to pharmacy  

## 2015-03-09 ENCOUNTER — Telehealth: Payer: Self-pay | Admitting: Cardiovascular Disease

## 2015-03-09 MED ORDER — ATORVASTATIN CALCIUM 80 MG PO TABS: 80.0000 mg | ORAL_TABLET | Freq: Every day | ORAL | Status: DC

## 2015-03-09 NOTE — Telephone Encounter (Signed)
90 day supply sent to pharmacy lipitor. Will send to Dr. Bronson Ing for alternatives on Lipitor. Pt aware of provider out of office.

## 2015-03-09 NOTE — Telephone Encounter (Signed)
[facility] in [place]  Patient is losing his and starting a new job 03/27/15 and will be on a 41 day probation period.  Asking if we can send in a 90 day supply refill to last him thru his period. Also was asking if there was anything cheaper than Lipitor

## 2015-03-10 ENCOUNTER — Other Ambulatory Visit: Payer: Self-pay | Admitting: Cardiovascular Disease

## 2015-03-10 MED ORDER — METOPROLOL SUCCINATE ER 25 MG PO TB24: 25.0000 mg | ORAL_TABLET | Freq: Every day | ORAL | Status: DC

## 2015-03-10 MED ORDER — EZETIMIBE 10 MG PO TABS: 10.0000 mg | ORAL_TABLET | Freq: Every day | ORAL | Status: DC

## 2015-03-10 NOTE — Telephone Encounter (Signed)
Wife came by to ask that all his medications be called in with 90 day supply.  Stated that only the Liptor was called into the pharmacy.  Please contact the patient

## 2015-03-23 NOTE — Telephone Encounter (Signed)
Lipitor is the most potent statin. Can try simvastatin 80 mg if that would be cheaper.

## 2015-03-24 NOTE — Telephone Encounter (Signed)
2 nd attempt - no answer on home number.

## 2015-03-24 NOTE — Telephone Encounter (Signed)
Mobile # - male answered stating that he took over his job at [employer] & now has that number.

## 2015-03-24 NOTE — Telephone Encounter (Signed)
No answer

## 2015-03-29 ENCOUNTER — Encounter: Payer: Self-pay | Admitting: *Deleted

## 2015-03-29 NOTE — Telephone Encounter (Signed)
3 rd attempt to notify patient.  Home # now answers to message that he can not receive calls.  Will mail note to patient today.

## 2015-07-08 ENCOUNTER — Emergency Department (HOSPITAL_COMMUNITY): Payer: Managed Care, Other (non HMO)

## 2015-07-08 ENCOUNTER — Observation Stay (HOSPITAL_COMMUNITY): Payer: Managed Care, Other (non HMO)

## 2015-07-08 ENCOUNTER — Encounter (HOSPITAL_COMMUNITY): Payer: Self-pay

## 2015-07-08 ENCOUNTER — Observation Stay (HOSPITAL_COMMUNITY)
Admission: EM | Admit: 2015-07-08 | Discharge: 2015-07-09 | Disposition: A | Discharge status: Home / Self Care | Payer: Managed Care, Other (non HMO) | Attending: Internal Medicine | Admitting: Internal Medicine

## 2015-07-08 DIAGNOSIS — H538 Other visual disturbances: Secondary | ICD-10-CM | POA: Diagnosis present

## 2015-07-08 DIAGNOSIS — G459 Transient cerebral ischemic attack, unspecified: Secondary | ICD-10-CM | POA: Diagnosis not present

## 2015-07-08 DIAGNOSIS — R079 Chest pain, unspecified: Secondary | ICD-10-CM | POA: Insufficient documentation

## 2015-07-08 DIAGNOSIS — Z79899 Other long term (current) drug therapy: Secondary | ICD-10-CM | POA: Diagnosis not present

## 2015-07-08 DIAGNOSIS — G453 Amaurosis fugax: Secondary | ICD-10-CM

## 2015-07-08 DIAGNOSIS — I2510 Atherosclerotic heart disease of native coronary artery without angina pectoris: Secondary | ICD-10-CM | POA: Diagnosis present

## 2015-07-08 DIAGNOSIS — H532 Diplopia: Secondary | ICD-10-CM | POA: Diagnosis not present

## 2015-07-08 DIAGNOSIS — I10 Essential (primary) hypertension: Secondary | ICD-10-CM | POA: Diagnosis not present

## 2015-07-08 DIAGNOSIS — R197 Diarrhea, unspecified: Secondary | ICD-10-CM | POA: Diagnosis not present

## 2015-07-08 DIAGNOSIS — Z720 Tobacco use: Secondary | ICD-10-CM | POA: Diagnosis present

## 2015-07-08 DIAGNOSIS — Z7982 Long term (current) use of aspirin: Secondary | ICD-10-CM | POA: Insufficient documentation

## 2015-07-08 DIAGNOSIS — E785 Hyperlipidemia, unspecified: Secondary | ICD-10-CM | POA: Diagnosis not present

## 2015-07-08 DIAGNOSIS — J939 Pneumothorax, unspecified: Secondary | ICD-10-CM | POA: Diagnosis not present

## 2015-07-08 HISTORY — DX: Acute myocardial infarction, unspecified: I21.9

## 2015-07-08 LAB — COMPREHENSIVE METABOLIC PANEL
ALBUMIN: 4 g/dL (ref 3.5–5.0)
ALT: 31 U/L (ref 17–63)
ANION GAP: 7 (ref 5–15)
AST: 27 U/L (ref 15–41)
Alkaline Phosphatase: 109 U/L (ref 38–126)
BILIRUBIN TOTAL: 0.8 mg/dL (ref 0.3–1.2)
BUN: 15 mg/dL (ref 6–20)
CALCIUM: 9.1 mg/dL (ref 8.9–10.3)
CHLORIDE: 104 mmol/L (ref 101–111)
CO2: 26 mmol/L (ref 22–32)
CREATININE: 1.05 mg/dL (ref 0.61–1.24)
GLUCOSE: 89 mg/dL (ref 65–99)
POTASSIUM: 4 mmol/L (ref 3.5–5.1)
Sodium: 137 mmol/L (ref 135–145)
Total Protein: 7.2 g/dL (ref 6.5–8.1)

## 2015-07-08 LAB — DIFFERENTIAL
Basophils Absolute: 0 10*3/uL (ref 0.0–0.1)
Basophils Relative: 0 %
EOS ABS: 0.2 10*3/uL (ref 0.0–0.7)
EOS PCT: 2 %
LYMPHS ABS: 1.5 10*3/uL (ref 0.7–4.0)
Lymphocytes Relative: 16 %
MONOS PCT: 9 %
Monocytes Absolute: 0.9 10*3/uL (ref 0.1–1.0)
NEUTROS PCT: 73 %
Neutro Abs: 7.1 10*3/uL (ref 1.7–7.7)

## 2015-07-08 LAB — RAPID URINE DRUG SCREEN, HOSP PERFORMED
AMPHETAMINES: NOT DETECTED
BENZODIAZEPINES: NOT DETECTED
Barbiturates: NOT DETECTED
Cocaine: NOT DETECTED
Opiates: NOT DETECTED
TETRAHYDROCANNABINOL: NOT DETECTED

## 2015-07-08 LAB — I-STAT CHEM 8, ED
BUN: 21 mg/dL — ABNORMAL HIGH (ref 6–20)
CREATININE: 1 mg/dL (ref 0.61–1.24)
Calcium, Ion: 1.11 mmol/L — ABNORMAL LOW (ref 1.12–1.23)
Chloride: 103 mmol/L (ref 101–111)
Glucose, Bld: 79 mg/dL (ref 65–99)
HEMATOCRIT: 48 % (ref 39.0–52.0)
HEMOGLOBIN: 16.3 g/dL (ref 13.0–17.0)
Potassium: 5.5 mmol/L — ABNORMAL HIGH (ref 3.5–5.1)
SODIUM: 137 mmol/L (ref 135–145)
TCO2: 28 mmol/L (ref 0–100)

## 2015-07-08 LAB — ETHANOL: Alcohol, Ethyl (B): <5 mg/dL (ref <5)

## 2015-07-08 LAB — URINALYSIS, ROUTINE W REFLEX MICROSCOPIC
BILIRUBIN URINE: NEGATIVE
Glucose, UA: NEGATIVE mg/dL
HGB URINE DIPSTICK: NEGATIVE
Ketones, ur: NEGATIVE mg/dL
Leukocytes, UA: NEGATIVE
NITRITE: NEGATIVE
PH: 6.5 (ref 5.0–8.0)
Protein, ur: NEGATIVE mg/dL
SPECIFIC GRAVITY, URINE: 1.01 (ref 1.005–1.030)

## 2015-07-08 LAB — CBC
HCT: 45.9 % (ref 39.0–52.0)
HEMOGLOBIN: 15.6 g/dL (ref 13.0–17.0)
MCH: 29.8 pg (ref 26.0–34.0)
MCHC: 34 g/dL (ref 30.0–36.0)
MCV: 87.8 fL (ref 78.0–100.0)
Platelets: 226 10*3/uL (ref 150–400)
RBC: 5.23 MIL/uL (ref 4.22–5.81)
RDW: 12.8 % (ref 11.5–15.5)
WBC: 9.6 10*3/uL (ref 4.0–10.5)

## 2015-07-08 LAB — PROTIME-INR
INR: 1.01 (ref 0.00–1.49)
Prothrombin Time: 13.5 seconds (ref 11.6–15.2)

## 2015-07-08 LAB — I-STAT TROPONIN, ED: Troponin i, poc: 0 ng/mL (ref 0.00–0.08)

## 2015-07-08 LAB — APTT: aPTT: 28 seconds (ref 24–37)

## 2015-07-08 LAB — POTASSIUM: POTASSIUM: 4.1 mmol/L (ref 3.5–5.1)

## 2015-07-08 MED ORDER — ENOXAPARIN SODIUM 40 MG/0.4ML ~~LOC~~ SOLN
40.0000 mg | SUBCUTANEOUS | Status: DC
  Administered 2015-07-08: 40 mg via SUBCUTANEOUS
  Filled 2015-07-08: qty 0.4

## 2015-07-08 MED ORDER — SENNOSIDES-DOCUSATE SODIUM 8.6-50 MG PO TABS: 1.0000 | ORAL_TABLET | Freq: Every evening | ORAL | Status: DC | PRN

## 2015-07-08 MED ORDER — SODIUM CHLORIDE 0.9 % IV SOLN
1000.0000 mL | Freq: Once | INTRAVENOUS | Status: AC
  Administered 2015-07-08: 1000 mL via INTRAVENOUS

## 2015-07-08 MED ORDER — IOHEXOL 350 MG/ML SOLN
75.0000 mL | Freq: Once | INTRAVENOUS | Status: AC | PRN
  Administered 2015-07-08: 75 mL via INTRAVENOUS

## 2015-07-08 MED ORDER — ASPIRIN EC 81 MG PO TBEC
81.0000 mg | DELAYED_RELEASE_TABLET | Freq: Every day | ORAL | Status: DC
  Administered 2015-07-09: 81 mg via ORAL
  Filled 2015-07-08: qty 1

## 2015-07-08 MED ORDER — ATORVASTATIN CALCIUM 40 MG PO TABS: 80.0000 mg | ORAL_TABLET | Freq: Every day | ORAL | Status: DC

## 2015-07-08 MED ORDER — METOPROLOL SUCCINATE ER 25 MG PO TB24
25.0000 mg | ORAL_TABLET | Freq: Every day | ORAL | Status: DC
  Administered 2015-07-09: 25 mg via ORAL
  Filled 2015-07-08: qty 1

## 2015-07-08 MED ORDER — EZETIMIBE 10 MG PO TABS
10.0000 mg | ORAL_TABLET | Freq: Every day | ORAL | Status: DC
  Administered 2015-07-09: 10 mg via ORAL
  Filled 2015-07-08: qty 1

## 2015-07-08 MED ORDER — ASPIRIN 81 MG PO CHEW
243.0000 mg | CHEWABLE_TABLET | Freq: Once | ORAL | Status: AC
  Administered 2015-07-08: 243 mg via ORAL
  Filled 2015-07-08: qty 3

## 2015-07-08 MED ORDER — STROKE: EARLY STAGES OF RECOVERY BOOK
Freq: Once | Status: AC
  Administered 2015-07-09: 1
  Filled 2015-07-08: qty 1

## 2015-07-08 NOTE — H&P (Signed)
[facility]          History and Physical    PCP:   [name], MD   EDP: Fredia Sorrow, M.D.  Chief Complaint:  Transient loss of vision   HPI: Patient is a 60 year old man with history of coronary artery disease status post CABG, hypertension, hyperlipidemia, continued tobacco abuse who was driving to work at 8:85 this morning when he noticed all of a sudden he couldn't read the big numbers on his dashboard. He looked up and had double vision. He pulled over to the side of the road. He thinks this resolved after 2 or 3 minutes. He continued and had a second episode. When back to work when he described these episodes to his boss he was sent to the emergency department for evaluation. Code stroke was called. Lab work was essentially unremarkable, toxic screen was negative, CT head did not show acute abnormalities. Tele neurology consult was obtained and they recommended CT angiogram of the head and neck which showed an occluded right vertebral artery with mild intracranial atherosclerosis and mild right PCA stenosis. These findings were discussed by EDP with Dr. [name] who believes no intervention is necessary and has recommended admission for TIA workup.   Allergies:   Allergies  Allergen Reactions  . Other Swelling    peas      Past Medical History  Diagnosis Date  . 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 Pneumothorax   . CAD (coronary artery disease)     NSTEMI/7 vessel CABG, 03/2012  . Tobacco abuse   . HLD (hyperlipidemia)   . HTN (hypertension)   . Heart attack [facility])     Past Surgical History  Procedure Laterality Date  . Thoracotomy      about 10 yrs ago  . Tonsillectomy    . Coronary artery bypass graft  03/27/2012    Procedure: CORONARY ARTERY BYPASS GRAFTING (CABG);  Surgeon: Grace Isaac, MD;  Location: [facility];  Service: Open Heart Surgery;  Laterality: N/A;  Coronary Artery Bypass Grafting X 7 Using Left Internal  Mammary Artery and Right Saphenous Leg Vein Harvested Endoscopically  . Left heart catheterization with coronary angiogram N/A 03/25/2012    Procedure: LEFT HEART CATHETERIZATION WITH CORONARY ANGIOGRAM;  Surgeon: Hillary Bow, MD;  Location: [facility] C[facility] LAB;  Service: Cardiovascular;  Laterality: N/A;    Prior to Admission medications   Medication Sig Start Date End Date Taking? Authorizing Provider  aspirin EC 81 MG tablet Take 81 mg by mouth Mandella.   Yes Historical Provider, MD  atorvastatin (LIPITOR) 80 MG tablet Take 1 tablet (80 mg total) by mouth Bartnik. 03/09/15  Yes Herminio Commons, MD  ezetimibe (ZETIA) 10 MG tablet Take 1 tablet (10 mg total) by mouth [place]. 03/10/15  Yes Herminio Commons, MD  metoprolol succinate (TOPROL-XL) 25 MG 24 hr tablet Take 1 tablet (25 mg total) by mouth [place]ull[place]. 03/10/15  Yes Herminio Commons, MD    Social History:  reports that he quit smoking about 3 years ago. His smoking use included Cigarettes. He started smoking about 40 years ago. He has a 45 pack-year smoking history. He has never used smokeless tobacco. He reports that he does not drink alcohol or use illicit drugs.  Family History  Problem Relation Age of Onset  . Heart disease Other     Review of Systems:  Constitutional: Denies fever, chills, diaphoresis,  appetite change and fatigue.  HEENT: Denies photophobia, eye pain, redness, hearing loss, ear pain, congestion, sore throat, rhinorrhea, sneezing, mouth sores, trouble swallowing, neck pain, neck stiffness and tinnitus.   Respiratory: Denies SOB, DOE, cough, chest tightness,  and wheezing.   Cardiovascular: Denies chest pain, palpitations and leg swelling.  Gastrointestinal: Denies nausea, vomiting, abdominal pain, diarrhea, constipation, blood in stool and abdominal distention.  Genitourinary: Denies dysuria, urgency, frequency, hematuria, flank pain and difficulty urinating.  Endocrine: Denies: hot or cold intolerance, sweats,  changes in hair or nails, polyuria, polydipsia. Musculoskeletal: Denies myalgias, back pain, joint swelling, arthralgias and gait problem.  Skin: Denies pallor, rash and wound.  Neurological: Denies dizziness, seizures, syncope, weakness, light-headedness, numbness and headaches.  Hematological: Denies adenopathy. Easy bruising, personal or family bleeding history  Psychiatric/Behavioral: Denies suicidal ideation, mood changes, confusion, nervousness, sleep disturbance and agitation   Physical Exam: Blood pressure 138/87, pulse 61, temperature 97.7 F (36.5 C), temperature source Oral, resp. rate 15, height 6' 1.5" (1.867 m), weight 90.175 kg (198 lb 12.8 oz), SpO2 100 %. General: Alert, awake, oriented 3, no distress H EENT: Normocephalic, atraumatic, pupils equal round and reactive to light, extraocular movements intact, moist mucous membranes Neck: Supple, no JVD, no lymphadenopathy, no bruits, no goiter Cardiovascular: Regular rate and rhythm, no murmurs, rubs or gallops Lungs: Clear to auscultation bilaterally Abdomen: Soft, nontender, nondistended, positive bowel sounds Extremities: No clubbing, cyanosis or edema, positive pulses Neurologic: Mental status intact, cranial nerves II through XII intact, muscle strength 5 over 5 bilateral and symmetric, DTRs 2+ symmetric, Babinski downgoing bilaterally, finger to nose normal, proprioception intact   Labs on Admission:  Results for orders placed or performed during the hospital encounter of 07/08/15 (from the past 48 hour(s))  Ethanol     Status: None   Collection Time: 07/08/15 11:04 AM  Result Value Ref Range   Alcohol, Ethyl (B) <5 <5 mg/dL    Comment:        LOWEST DETECTABLE LIMIT FOR SERUM ALCOHOL IS 5 mg/dL FOR MEDICAL PURPOSES ONLY   Protime-INR     Status: None   Collection Time: 07/08/15 11:04 AM  Result Value Ref Range   Prothrombin Time 13.5 11.6 - 15.2 seconds   INR 1.01 0.00 - 1.49  APTT     Status: None    Collection Time: 07/08/15 11:04 AM  Result Value Ref Range   aPTT 28 24 - 37 seconds  CBC     Status: None   Collection Time: 07/08/15 11:04 AM  Result Value Ref Range   WBC 9.6 4.0 - 10.5 K/uL   RBC 5.23 4.22 - 5.81 MIL/uL   Hemoglobin 15.6 13.0 - 17.0 g/dL   HCT 45.9 39.0 - 52.0 %   MCV 87.8 78.0 - 100.0 fL   MCH 29.8 26.0 - 34.0 pg   MCHC 34.0 30.0 - 36.0 g/dL   RDW 12.8 11.5 - 15.5 %   Platelets 226 150 - 400 K/uL  Differential     Status: None   Collection Time: 07/08/15 11:04 AM  Result Value Ref Range   Neutrophils Relative % 73 %   Neutro Abs 7.1 1.7 - 7.7 K/uL   Lymphocytes Relative 16 %   Lymphs Abs 1.5 0.7 - 4.0 K/uL   Monocytes Relative 9 %   Monocytes Absolute 0.9 0.1 - 1.0 K/uL   Eosinophils Relative 2 %   Eosinophils Absolute 0.2 0.0 - 0.7 K/uL   Basophils Relative 0 %   Basophils  Absolute 0.0 0.0 - 0.1 K/uL  Comprehensive metabolic panel     Status: None   Collection Time: 07/08/15 11:04 AM  Result Value Ref Range   Sodium 137 135 - 145 mmol/L   Potassium 4.0 3.5 - 5.1 mmol/L   Chloride 104 101 - 111 mmol/L   CO2 26 22 - 32 mmol/L   Glucose, Bld 89 65 - 99 mg/dL   BUN 15 6 - 20 mg/dL   Creatinine, Ser 1.05 0.61 - 1.24 mg/dL   Calcium 9.1 8.9 - 10.3 mg/dL   Total Protein 7.2 6.5 - 8.1 g/dL   Albumin 4.0 3.5 - 5.0 g/dL   AST 27 15 - 41 U/L   ALT 31 17 - 63 U/L   Alkaline Phosphatase 109 38 - 126 U/L   Total Bilirubin 0.8 0.3 - 1.2 mg/dL   GFR calc non Af Amer >60 >60 mL/min   GFR calc Af Amer >60 >60 mL/min    Comment: (NOTE) The eGFR has been calculated using the CKD EPI equation. This calculation has not been validated in all clinical situations. eGFR's persistently <60 mL/min signify possible Chronic Kidney Disease.    Anion gap 7 5 - 15  I-stat troponin, ED (not at [facility], [facility])     Status: None   Collection Time: 07/08/15 11:28 AM  Result Value Ref Range   Troponin i, poc 0.00 0.00 - 0.08 ng/mL   Comment 3            Comment: Due to the  release kinetics of cTnI, a negative result within the first hours of the onset of symptoms does not rule out myocardial infarction with certainty. If myocardial infarction is still suspected, repeat the test at appropriate intervals.   I-Stat Chem 8, ED  (not at [facility], [facility])     Status: Abnormal   Collection Time: 07/08/15 11:29 AM  Result Value Ref Range   Sodium 137 135 - 145 mmol/L   Potassium 5.5 (H) 3.5 - 5.1 mmol/L   Chloride 103 101 - 111 mmol/L   BUN 21 (H) 6 - 20 mg/dL   Creatinine, Ser 1.00 0.61 - 1.24 mg/dL   Glucose, Bld 79 65 - 99 mg/dL   Calcium, Ion 1.11 (L) 1.12 - 1.23 mmol/L   TCO2 28 0 - 100 mmol/L   Hemoglobin 16.3 13.0 - 17.0 g/dL   HCT 48.0 39.0 - 52.0 %  Urine rapid drug screen (hosp performed)not at [facility]     Status: None   Collection Time: 07/08/15 12:35 PM  Result Value Ref Range   Opiates NONE DETECTED NONE DETECTED   Cocaine NONE DETECTED NONE DETECTED   Benzodiazepines NONE DETECTED NONE DETECTED   Amphetamines NONE DETECTED NONE DETECTED   Tetrahydrocannabinol NONE DETECTED NONE DETECTED   Barbiturates NONE DETECTED NONE DETECTED    Comment:        DRUG SCREEN FOR MEDICAL PURPOSES ONLY.  IF CONFIRMATION IS NEEDED FOR ANY PURPOSE, NOTIFY LAB WITHIN 5 DAYS.        LOWEST DETECTABLE LIMITS FOR URINE DRUG SCREEN Drug Class       Cutoff (ng/mL) Amphetamine      1000 Barbiturate      200 Benzodiazepine   093 Tricyclics       818 Opiates          300 Cocaine          300 THC              50  Urinalysis, Routine w reflex microscopic (not at [facility])     Status: None   Collection Time: 07/08/15 12:35 PM  Result Value Ref Range   Color, Urine YELLOW YELLOW   APPearance CLEAR CLEAR   Specific Gravity, Urine 1.010 1.005 - 1.030   pH 6.5 5.0 - 8.0   Glucose, UA NEGATIVE NEGATIVE mg/dL   Hgb urine dipstick NEGATIVE NEGATIVE   Bilirubin Urine NEGATIVE NEGATIVE   Ketones, ur NEGATIVE NEGATIVE mg/dL   Protein, ur NEGATIVE NEGATIVE mg/dL   Nitrite  NEGATIVE NEGATIVE   Leukocytes, UA NEGATIVE NEGATIVE    Comment: MICROSCOPIC NOT DONE ON URINES WITH NEGATIVE PROTEIN, BLOOD, LEUKOCYTES, NITRITE, OR GLUCOSE <1000 mg/dL.  Potassium     Status: None   Collection Time: 07/08/15 12:46 PM  Result Value Ref Range   Potassium 4.1 3.5 - 5.1 mmol/L    Comment: DELTA CHECK NOTED    Radiological Exams on Admission: Ct Angio Head W/cm &/or Wo Cm  07/08/2015  CLINICAL DATA:  Blurred/double vision this morning, improved now. EXAM: CT ANGIOGRAPHY HEAD AND NECK TECHNIQUE: Multidetector CT imaging of the head and neck was performed using the standard protocol during bolus administration of intravenous contrast. Multiplanar CT image reconstructions and MIPs were obtained to evaluate the vascular anatomy. Carotid stenosis measurements (when applicable) are obtained utilizing NASCET criteria, using the distal internal carotid diameter as the denominator. CONTRAST:  54m OMNIPAQUE IOHEXOL 350 MG/ML SOLN COMPARISON:  Noncontrast head CT earlier today FINDINGS: CT HEAD Brain: There is no evidence of acute infarct, intracranial hemorrhage, mass, midline shift, or extra-axial fluid collection on this contrast-enhanced study. Slight cerebral atrophy is noted. Hypodensities in the cerebral white matter are nonspecific but compatible with mild chronic small vessel ischemic disease. Calvarium and skull base: No fracture or destructive osseous lesion. Paranasal sinuses: Clear. Prominent leftward nasal septal deviation noted. Orbits: Unremarkable. CTA NECK Aortic arch: 3 vessel aortic arch. Brachiocephalic and subclavian arteries are widely patent. Right carotid system: Widely patent without evidence of stenosis, dissection, or significant atherosclerosis. Left carotid system: Patent with minimal non stenotic plaque about the carotid bifurcation. Vertebral arteries: The right vertebral artery is occluded at/ near its origin with only trace segmental reconstitution of its distal V2  segment. The left vertebral artery is widely patent and dominant. Skeleton: Booth internal fixation screws in the posterior body/angle of the mandible bilaterally. Other neck: Scarring, centrilobular emphysema, and paraseptal emphysema in the lung apices. CTA HEAD Anterior circulation: The internal carotid arteries are patent from skullbase to carotid termini without stenosis. ACAs and MCAs are patent with at most mild branch vessel irregularity and without evidence of significant proximal stenosis or major branch vessel occlusion. No intracranial aneurysm is identified. Posterior circulation: Widely patent intracranial vertebral artery on the left. Opacification of small distal right V3 and V4 segments is likely retrograde. PICA and SCA origins are patent. Basilar artery is patent without stenosis. There is a patent posterior communicating artery on the right with the right P1 segment being hypoplastic. No high-grade proximal PCA stenosis is seen, however there is mild right mid P2 stenosis and there is mild diffuse irregularity of the right greater than left PCAs from the P2 segments distally. Venous sinuses: Patent. Anatomic variants: Fetal type origin of the right PCA. Delayed phase: No abnormal enhancement. IMPRESSION: 1. Occluded right vertebral artery. Widely patent and dominant left vertebral artery. 2. Mild intracranial atherosclerosis without evidence of flow-limiting proximal stenosis. Mild right P2 PCA stenosis. 3. No cervical carotid artery stenosis. Electronically  Signed   By: Logan Bores M.D.   On: 07/08/2015 14:00   Ct Head Wo Contrast  07/08/2015  CLINICAL DATA:  LEFT side weakness beginning this morning, code stroke, hypertension, coronary artery disease, former smoker EXAM: CT HEAD WITHOUT CONTRAST TECHNIQUE: Contiguous axial images were obtained from the base of the skull through the vertex without intravenous contrast. COMPARISON:  None FINDINGS: Generalized atrophy. Normal ventricular  morphology. No midline shift or mass effect. Small vessel chronic ischemic changes of deep cerebral white matter. No intracranial hemorrhage, mass lesion, or evidence acute infarction. No extra-axial fluid collections. Slight flattening of the LEFT ventral aspect of the brainstem by a tortuous LEFT vertebral artery compatible with vertebral dolichoectasia. Visualized paranasal sinuses and mastoid air cells clear. No acute osseous findings. IMPRESSION: Atrophy with small vessel chronic ischemic changes of deep cerebral white matter. No acute intracranial abnormalities. Critical Value/emergent results were called by telephone at the time of interpretation on 07/08/2015 at 11:43 am to Dr. Fredia Sorrow , who verbally acknowledged these results. Electronically Signed   By: Lavonia Dana M.D.   On: 07/08/2015 11:43   Ct Angio Neck W/cm &/or Wo/cm  07/08/2015  CLINICAL DATA:  Blurred/double vision this morning, improved now. EXAM: CT ANGIOGRAPHY HEAD AND NECK TECHNIQUE: Multidetector CT imaging of the head and neck was performed using the standard protocol during bolus administration of intravenous contrast. Multiplanar CT image reconstructions and MIPs were obtained to evaluate the vascular anatomy. Carotid stenosis measurements (when applicable) are obtained utilizing NASCET criteria, using the distal internal carotid diameter as the denominator. CONTRAST:  76m OMNIPAQUE IOHEXOL 350 MG/ML SOLN COMPARISON:  Noncontrast head CT earlier today FINDINGS: CT HEAD Brain: There is no evidence of acute infarct, intracranial hemorrhage, mass, midline shift, or extra-axial fluid collection on this contrast-enhanced study. Slight cerebral atrophy is noted. Hypodensities in the cerebral white matter are nonspecific but compatible with mild chronic small vessel ischemic disease. Calvarium and skull base: No fracture or destructive osseous lesion. Paranasal sinuses: Clear. Prominent leftward nasal septal deviation noted. Orbits:  Unremarkable. CTA NECK Aortic arch: 3 vessel aortic arch. Brachiocephalic and subclavian arteries are widely patent. Right carotid system: Widely patent without evidence of stenosis, dissection, or significant atherosclerosis. Left carotid system: Patent with minimal non stenotic plaque about the carotid bifurcation. Vertebral arteries: The right vertebral artery is occluded at/ near its origin with only trace segmental reconstitution of its distal V2 segment. The left vertebral artery is widely patent and dominant. Skeleton: Booth internal fixation screws in the posterior body/angle of the mandible bilaterally. Other neck: Scarring, centrilobular emphysema, and paraseptal emphysema in the lung apices. CTA HEAD Anterior circulation: The internal carotid arteries are patent from skullbase to carotid termini without stenosis. ACAs and MCAs are patent with at most mild branch vessel irregularity and without evidence of significant proximal stenosis or major branch vessel occlusion. No intracranial aneurysm is identified. Posterior circulation: Widely patent intracranial vertebral artery on the left. Opacification of small distal right V3 and V4 segments is likely retrograde. PICA and SCA origins are patent. Basilar artery is patent without stenosis. There is a patent posterior communicating artery on the right with the right P1 segment being hypoplastic. No high-grade proximal PCA stenosis is seen, however there is mild right mid P2 stenosis and there is mild diffuse irregularity of the right greater than left PCAs from the P2 segments distally. Venous sinuses: Patent. Anatomic variants: Fetal type origin of the right PCA. Delayed phase: No abnormal enhancement. IMPRESSION: 1. Occluded  right vertebral artery. Widely patent and dominant left vertebral artery. 2. Mild intracranial atherosclerosis without evidence of flow-limiting proximal stenosis. Mild right P2 PCA stenosis. 3. No cervical carotid artery stenosis.  Electronically Signed   By: Logan Bores M.D.   On: 07/08/2015 14:00    Assessment/Plan Principal Problem:   TIA (transient ischemic attack) Active Problems:   Amaurosis fugax   CAD (coronary artery disease)   Tobacco abuse   HLD (hyperlipidemia)   HTN (hypertension)   TIA/Amaurosis Fugax -Admit for TIA workup to telemetry, check MRI, check lipid status, continue aspirin, continue upgrading to Plavix pending neurology recommendations. PT evaluation.  Coronary artery disease -Stable, no chest pain.  Benign essential hypertension -Continue Toprol.  Hyperlipidemia -Continue statin  DVT prophylaxis Lovenox  CODE STATUS Full Code    Time Spent on Admission: 95 minutes  [facility] Pager: [phone] 07/08/2015, 5:57 PM

## 2015-07-08 NOTE — ED Provider Notes (Signed)
CSN: [id number]     Arrival date & time 07/08/15  1022 History  By signing my name below, I, Terressa Koyanagi, attest that this documentation has been prepared under the direction and in the presence of Fredia Sorrow, MD. Electronically Signed: Terressa Koyanagi, ED Scribe. 07/08/2015. 2:34 PM.  Chief Complaint  Patient presents with  . Blurred Vision  . Chest Pain  Patient is a 60 y.o. male presenting with chest pain and eye problem. The history is provided by the patient. No language interpreter was used.  Chest Pain Pain severity:  Mild Associated symptoms: no abdominal pain, no back pain, no cough, no fever, no headache, no nausea, no shortness of breath and not vomiting   Eye Problem Location:  Both Quality: blurred vision and double vision. Onset quality:  Sudden Duration:  5 minutes (2 episodes, 5 minutes each) Timing:  Intermittent Progression:  Resolved Chronicity:  New Context: not burn, not chemical exposure, not direct trauma, not foreign body, not using machinery, not scratch, not smoke exposure and not tanning booth use   Associated symptoms: no headaches, no nausea and no vomiting    PCP: [name], MD HPI Comments: Shawn Carey is a 60 y.o. male, with PMHx noted below including HTN, heart attack, left heart catheterization with coronary angiogram on 03/25/12, and CABG (03/27/2012), who presents to the Emergency Department complaining of two episodes of blurred vision and double vision onset this morning. Pt specifies his first episode of blurred vision and double vision occurred at 6AM this morning while pt was driving to work; the episode resolved after 5 minutes; thereafter, pt had his second episode of the same Sx at 6:30AM which also resolved after 5 minutes. Associated Sx include mild chest discomfort/fullness. Pt also complains of intermittent sensation of "food not traveling down right" for the past couple of weeks-- pt reports experiencing similar Sx prior to his last heart  attack. Pt confirms two episodes of diarrhea today and mild SOB with exertion at baseline. Pt, however, denies fever, chills, headache, numbness, weakness, cough, rhinorrhea, sore throat, hematuria, back pain, rash, leg swelling, n/v, blood in stool, dysuria, hematuria, headache. Pt further denies Hx of bleeding easily/blood thinner use.   Past Medical History  Diagnosis Date  . 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 Pneumothorax   . CAD (coronary artery disease)     NSTEMI/7 vessel CABG, 03/2012  . Tobacco abuse   . HLD (hyperlipidemia)   . HTN (hypertension)   . Heart attack [facility])    Past Surgical History  Procedure Laterality Date  . Thoracotomy      about 10 yrs ago  . Tonsillectomy    . Coronary artery bypass graft  03/27/2012    Procedure: CORONARY ARTERY BYPASS GRAFTING (CABG);  Surgeon: Grace Isaac, MD;  Location: [place];  Service: Open Heart Surgery;  Laterality: N/A;  Coronary Artery Bypass Grafting X 7 Using Left Internal Mammary Artery and Right Saphenous Leg Vein Harvested Endoscopically  . Left heart catheterization with coronary angiogram N/A 03/25/2012    Procedure: LEFT HEART CATHETERIZATION WITH CORONARY ANGIOGRAM;  Surgeon: Hillary Bow, MD;  Location: [facility] CATH LAB;  Service: Cardiovascular;  Laterality: N/A;   Family History  Problem Relation Age of Onset  . Heart disease Other    Social History  Substance Use Topics  . Smoking status: Former Smoker -- 1.50 packs/day for 30 years    Types: Cigarettes    Start date: 06/27/1975    Quit date: 03/24/2012  .  Smokeless tobacco: Never Used     Comment: currently uses an electric cigarette-has zero nicotine  . Alcohol Use: No    Review of Systems  Constitutional: Negative for fever and chills.  HENT: Negative for congestion, rhinorrhea and sore throat.   Eyes: Positive for visual disturbance.  Respiratory: Negative for cough and shortness of breath.   Cardiovascular: Positive for chest pain.  Negative for leg swelling.  Gastrointestinal: Positive for diarrhea. Negative for nausea, vomiting and abdominal pain.  Genitourinary: Negative for dysuria and hematuria.  Musculoskeletal: Negative for back pain.  Skin: Negative for rash.  Neurological: Negative for light-headedness and headaches.  Hematological: Does not bruise/bleed easily.  Psychiatric/Behavioral: Negative for confusion.   Allergies  Other  Home Medications   Prior to Admission medications   Medication Sig Start Date End Date Taking? Authorizing Provider  aspirin EC 81 MG tablet Take 81 mg by mouth Ariel.   Yes Historical Provider, MD  atorvastatin (LIPITOR) 80 MG tablet Take 1 tablet (80 mg total) by mouth Cromwell. 03/09/15  Yes Herminio Commons, MD  ezetimibe (ZETIA) 10 MG tablet Take 1 tablet (10 mg total) by mouth Clavijo. 03/10/15  Yes Herminio Commons, MD  metoprolol succinate (TOPROL-XL) 25 MG 24 hr tablet Take 1 tablet (25 mg total) by mouth Molyneux. 03/10/15  Yes Herminio Commons, MD   Triage Vitals: BP 131/81 mmHg  Pulse 62  Resp 16  SpO2 100% Physical Exam  Constitutional: He is oriented to person, place, and time. He appears well-developed and well-nourished.  HENT:  Head: Normocephalic.  Mouth/Throat: Mucous membranes are normal.  Eyes: EOM are normal. Pupils are equal, round, and reactive to light. No scleral icterus.  Eyes tracking normal   Neck: Normal range of motion.  Cardiovascular: Normal rate and regular rhythm.   Pulmonary/Chest: Effort normal and breath sounds normal. No respiratory distress.  Abdominal: Soft. Bowel sounds are normal. He exhibits no distension. There is no tenderness.  Musculoskeletal: Normal range of motion. He exhibits no edema.  Neurological: He is alert and oriented to person, place, and time. He displays normal reflexes. He exhibits normal muscle tone. Coordination normal.  Psychiatric: He has a normal mood and affect.  Nursing note and vitals reviewed.  ED  Course  Procedures (including critical care time) DIAGNOSTIC STUDIES: Oxygen Saturation is 100% on ra, nl by my interpretation.    COORDINATION OF CARE: 10:52 AM: Discussed treatment plan which includes stroke work up, neurology consult, and possible hospital admittance, with pt at bedside; patient verbalizes understanding and agrees with treatment plan. 11:58 AM: Phone consult with neurology who recommends CT angio of brain and neck.   Labs Review Labs Reviewed  I-STAT CHEM 8, ED - Abnormal; Notable for the following:    Potassium 5.5 (*)    BUN 21 (*)    Calcium, Ion 1.11 (*)    All other components within normal limits  ETHANOL  PROTIME-INR  APTT  CBC  DIFFERENTIAL  COMPREHENSIVE METABOLIC PANEL  URINE RAPID DRUG SCREEN, HOSP PERFORMED  URINALYSIS, ROUTINE W REFLEX MICROSCOPIC (NOT AT [facility])  POTASSIUM  I-STAT TROPOININ, ED   Results for orders placed or performed during the hospital encounter of 07/08/15  Ethanol  Result Value Ref Range   Alcohol, Ethyl (B) <5 <5 mg/dL  Protime-INR  Result Value Ref Range   Prothrombin Time 13.5 11.6 - 15.2 seconds   INR 1.01 0.00 - 1.49  APTT  Result Value Ref Range   aPTT 28 24 -  37 seconds  CBC  Result Value Ref Range   WBC 9.6 4.0 - 10.5 K/uL   RBC 5.23 4.22 - 5.81 MIL/uL   Hemoglobin 15.6 13.0 - 17.0 g/dL   HCT 45.9 39.0 - 52.0 %   MCV 87.8 78.0 - 100.0 fL   MCH 29.8 26.0 - 34.0 pg   MCHC 34.0 30.0 - 36.0 g/dL   RDW 12.8 11.5 - 15.5 %   Platelets 226 150 - 400 K/uL  Differential  Result Value Ref Range   Neutrophils Relative % 73 %   Neutro Abs 7.1 1.7 - 7.7 K/uL   Lymphocytes Relative 16 %   Lymphs Abs 1.5 0.7 - 4.0 K/uL   Monocytes Relative 9 %   Monocytes Absolute 0.9 0.1 - 1.0 K/uL   Eosinophils Relative 2 %   Eosinophils Absolute 0.2 0.0 - 0.7 K/uL   Basophils Relative 0 %   Basophils Absolute 0.0 0.0 - 0.1 K/uL  Comprehensive metabolic panel  Result Value Ref Range   Sodium 137 135 - 145 mmol/L   Potassium  4.0 3.5 - 5.1 mmol/L   Chloride 104 101 - 111 mmol/L   CO2 26 22 - 32 mmol/L   Glucose, Bld 89 65 - 99 mg/dL   BUN 15 6 - 20 mg/dL   Creatinine, Ser 1.05 0.61 - 1.24 mg/dL   Calcium 9.1 8.9 - 10.3 mg/dL   Total Protein 7.2 6.5 - 8.1 g/dL   Albumin 4.0 3.5 - 5.0 g/dL   AST 27 15 - 41 U/L   ALT 31 17 - 63 U/L   Alkaline Phosphatase 109 38 - 126 U/L   Total Bilirubin 0.8 0.3 - 1.2 mg/dL   GFR calc non Af Amer >60 >60 mL/min   GFR calc Af Amer >60 >60 mL/min   Anion gap 7 5 - 15  Urine rapid drug screen (hosp performed)not at [facility]  Result Value Ref Range   Opiates NONE DETECTED NONE DETECTED   Cocaine NONE DETECTED NONE DETECTED   Benzodiazepines NONE DETECTED NONE DETECTED   Amphetamines NONE DETECTED NONE DETECTED   Tetrahydrocannabinol NONE DETECTED NONE DETECTED   Barbiturates NONE DETECTED NONE DETECTED  Urinalysis, Routine w reflex microscopic (not at [facility])  Result Value Ref Range   Color, Urine YELLOW YELLOW   APPearance CLEAR CLEAR   Specific Gravity, Urine 1.010 1.005 - 1.030   pH 6.5 5.0 - 8.0   Glucose, UA NEGATIVE NEGATIVE mg/dL   Hgb urine dipstick NEGATIVE NEGATIVE   Bilirubin Urine NEGATIVE NEGATIVE   Ketones, ur NEGATIVE NEGATIVE mg/dL   Protein, ur NEGATIVE NEGATIVE mg/dL   Nitrite NEGATIVE NEGATIVE   Leukocytes, UA NEGATIVE NEGATIVE  Potassium  Result Value Ref Range   Potassium 4.1 3.5 - 5.1 mmol/L  I-Stat Chem 8, ED  (not at [facility])  Result Value Ref Range   Sodium 137 135 - 145 mmol/L   Potassium 5.5 (H) 3.5 - 5.1 mmol/L   Chloride 103 101 - 111 mmol/L   BUN 21 (H) 6 - 20 mg/dL   Creatinine, Ser 1.00 0.61 - 1.24 mg/dL   Glucose, Bld 79 65 - 99 mg/dL   Calcium, Ion 1.11 (L) 1.12 - 1.23 mmol/L   TCO2 28 0 - 100 mmol/L   Hemoglobin 16.3 13.0 - 17.0 g/dL   HCT 48.0 39.0 - 52.0 %  I-stat troponin, ED (not at [facility])  Result Value Ref Range   Troponin i, poc 0.00 0.00 - 0.08 ng/mL  Comment 3             Imaging Review Ct Angio Head W/cm  &/or Wo Cm  07/08/2015  CLINICAL DATA:  Blurred/double vision this morning, improved now. EXAM: CT ANGIOGRAPHY HEAD AND NECK TECHNIQUE: Multidetector CT imaging of the head and neck was performed using the standard protocol during bolus administration of intravenous contrast. Multiplanar CT image reconstructions and MIPs were obtained to evaluate the vascular anatomy. Carotid stenosis measurements (when applicable) are obtained utilizing NASCET criteria, using the distal internal carotid diameter as the denominator. CONTRAST:  30mL OMNIPAQUE IOHEXOL 350 MG/ML SOLN COMPARISON:  Noncontrast head CT earlier today FINDINGS: CT HEAD Brain: There is no evidence of acute infarct, intracranial hemorrhage, mass, midline shift, or extra-axial fluid collection on this contrast-enhanced study. Slight cerebral atrophy is noted. Hypodensities in the cerebral white matter are nonspecific but compatible with mild chronic small vessel ischemic disease. Calvarium and skull base: No fracture or destructive osseous lesion. Paranasal sinuses: Clear. Prominent leftward nasal septal deviation noted. Orbits: Unremarkable. CTA NECK Aortic arch: 3 vessel aortic arch. Brachiocephalic and subclavian arteries are widely patent. Right carotid system: Widely patent without evidence of stenosis, dissection, or significant atherosclerosis. Left carotid system: Patent with minimal non stenotic plaque about the carotid bifurcation. Vertebral arteries: The right vertebral artery is occluded at/ near its origin with only trace segmental reconstitution of its distal V2 segment. The left vertebral artery is widely patent and dominant. Skeleton: Booth internal fixation screws in the posterior body/angle of the mandible bilaterally. Other neck: Scarring, centrilobular emphysema, and paraseptal emphysema in the lung apices. CTA HEAD Anterior circulation: The internal carotid arteries are patent from skullbase to carotid termini without stenosis. ACAs and  MCAs are patent with at most mild branch vessel irregularity and without evidence of significant proximal stenosis or major branch vessel occlusion. No intracranial aneurysm is identified. Posterior circulation: Widely patent intracranial vertebral artery on the left. Opacification of small distal right V3 and V4 segments is likely retrograde. PICA and SCA origins are patent. Basilar artery is patent without stenosis. There is a patent posterior communicating artery on the right with the right P1 segment being hypoplastic. No high-grade proximal PCA stenosis is seen, however there is mild right mid P2 stenosis and there is mild diffuse irregularity of the right greater than left PCAs from the P2 segments distally. Venous sinuses: Patent. Anatomic variants: Fetal type origin of the right PCA. Delayed phase: No abnormal enhancement. IMPRESSION: 1. Occluded right vertebral artery. Widely patent and dominant left vertebral artery. 2. Mild intracranial atherosclerosis without evidence of flow-limiting proximal stenosis. Mild right P2 PCA stenosis. 3. No cervical carotid artery stenosis. Electronically Signed   By: Logan Bores M.D.   On: 07/08/2015 14:00   Ct Head Wo Contrast  07/08/2015  CLINICAL DATA:  LEFT side weakness beginning this morning, code stroke, hypertension, coronary artery disease, former smoker EXAM: CT HEAD WITHOUT CONTRAST TECHNIQUE: Contiguous axial images were obtained from the base of the skull through the vertex without intravenous contrast. COMPARISON:  None FINDINGS: Generalized atrophy. Normal ventricular morphology. No midline shift or mass effect. Small vessel chronic ischemic changes of deep cerebral white matter. No intracranial hemorrhage, mass lesion, or evidence acute infarction. No extra-axial fluid collections. Slight flattening of the LEFT ventral aspect of the brainstem by a tortuous LEFT vertebral artery compatible with vertebral dolichoectasia. Visualized paranasal sinuses and  mastoid air cells clear. No acute osseous findings. IMPRESSION: Atrophy with small vessel chronic ischemic changes of deep  cerebral white matter. No acute intracranial abnormalities. Critical Value/emergent results were called by telephone at the time of interpretation on 07/08/2015 at 11:43 am to Dr. Fredia Sorrow , who verbally acknowledged these results. Electronically Signed   By: Lavonia Dana M.D.   On: 07/08/2015 11:43   Ct Angio Neck W/cm &/or Wo/cm  07/08/2015  CLINICAL DATA:  Blurred/double vision this morning, improved now. EXAM: CT ANGIOGRAPHY HEAD AND NECK TECHNIQUE: Multidetector CT imaging of the head and neck was performed using the standard protocol during bolus administration of intravenous contrast. Multiplanar CT image reconstructions and MIPs were obtained to evaluate the vascular anatomy. Carotid stenosis measurements (when applicable) are obtained utilizing NASCET criteria, using the distal internal carotid diameter as the denominator. CONTRAST:  59mL OMNIPAQUE IOHEXOL 350 MG/ML SOLN COMPARISON:  Noncontrast head CT earlier today FINDINGS: CT HEAD Brain: There is no evidence of acute infarct, intracranial hemorrhage, mass, midline shift, or extra-axial fluid collection on this contrast-enhanced study. Slight cerebral atrophy is noted. Hypodensities in the cerebral white matter are nonspecific but compatible with mild chronic small vessel ischemic disease. Calvarium and skull base: No fracture or destructive osseous lesion. Paranasal sinuses: Clear. Prominent leftward nasal septal deviation noted. Orbits: Unremarkable. CTA NECK Aortic arch: 3 vessel aortic arch. Brachiocephalic and subclavian arteries are widely patent. Right carotid system: Widely patent without evidence of stenosis, dissection, or significant atherosclerosis. Left carotid system: Patent with minimal non stenotic plaque about the carotid bifurcation. Vertebral arteries: The right vertebral artery is occluded at/ near its  origin with only trace segmental reconstitution of its distal V2 segment. The left vertebral artery is widely patent and dominant. Skeleton: Booth internal fixation screws in the posterior body/angle of the mandible bilaterally. Other neck: Scarring, centrilobular emphysema, and paraseptal emphysema in the lung apices. CTA HEAD Anterior circulation: The internal carotid arteries are patent from skullbase to carotid termini without stenosis. ACAs and MCAs are patent with at most mild branch vessel irregularity and without evidence of significant proximal stenosis or major branch vessel occlusion. No intracranial aneurysm is identified. Posterior circulation: Widely patent intracranial vertebral artery on the left. Opacification of small distal right V3 and V4 segments is likely retrograde. PICA and SCA origins are patent. Basilar artery is patent without stenosis. There is a patent posterior communicating artery on the right with the right P1 segment being hypoplastic. No high-grade proximal PCA stenosis is seen, however there is mild right mid P2 stenosis and there is mild diffuse irregularity of the right greater than left PCAs from the P2 segments distally. Venous sinuses: Patent. Anatomic variants: Fetal type origin of the right PCA. Delayed phase: No abnormal enhancement. IMPRESSION: 1. Occluded right vertebral artery. Widely patent and dominant left vertebral artery. 2. Mild intracranial atherosclerosis without evidence of flow-limiting proximal stenosis. Mild right P2 PCA stenosis. 3. No cervical carotid artery stenosis. Electronically Signed   By: Logan Bores M.D.   On: 07/08/2015 14:00   I have personally reviewed and evaluated these images and lab results as part of my medical decision-making.   EKG Interpretation   Date/Time:  Thursday July 08 2015 10:33:40 EST Ventricular Rate:  59 PR Interval:  198 QRS Duration: 98 QT Interval:  410 QTC Calculation: 406 R Axis:   79 Text Interpretation:   Sinus rhythm Borderline low voltage, extremity leads  Anteroseptal infarct, old Confirmed by Jolicia Delira  MD, Kamyiah Colantonio [phone]) on  07/08/2015 10:39:45 AM        CRITICAL CARE Performed by: Fredia Sorrow Total critical care  time: 30 minutes Critical care time was exclusive of separately billable procedures and treating other patients. Critical care was necessary to treat or prevent imminent or life-threatening deterioration. Critical care was time spent personally by me on the following activities: development of treatment plan with patient and/or surrogate as well as nursing, discussions with consultants, evaluation of patient's response to treatment, examination of patient, obtaining history from patient or surrogate, ordering and performing treatments and interventions, ordering and review of laboratory studies, ordering and review of radiographic studies, pulse oximetry and re-evaluation of patient's condition.  MDM   Final diagnoses:  Double vision  Transient cerebral ischemia, unspecified transient cerebral ischemia type   Patient initially treated as potential code stroke is a recurrent TIA however no symptoms have occurred since 6:30 this morning. Evaluated by the tele neurologist. They recommended CT angios of the head and neck which was done. In addition they recommended fluids and full dose of aspirin which was done.  Patient is normally on a baby aspirin a day. Patient without any recurrent symptoms. Discussed with Dr. doing quad on call neurologist results of CT angiogram discussed with him and patient is appropriate for admission here. He will consult. We'll have hospitalist admit.  Since patient was asymptomatic at the time of the tele neurologist interview, TPA not needed or appropriate.   I personally performed the services described in this documentation, which was scribed in my presence. The recorded information has been reviewed and is accurate.      Fredia Sorrow,  MD 07/08/15 1436

## 2015-07-08 NOTE — ED Notes (Signed)
MD at bedside. 

## 2015-07-08 NOTE — Progress Notes (Signed)
1126 AM BEEPER 1133 AM ON CT TABLE 1135 AM EXAM DONE 1135 AM IMAGES SENT TO SOC 1137 CALLED [facility]

## 2015-07-08 NOTE — ED Notes (Signed)
Pt reports was driving to work at [phone] this morning and had sudden onset of blurred vision and seeing double.  Reports pulled off of the road and sat in vehicle for about 82min and vision cleared up.  Reports continued driving to work and had to pull of the road again for same.  Again, symptoms lasted approx 5 min.  Pt also reports has felt a sensation in chest like something is caught in his throat.  Pt says had similar sensation in chest when he had a heart attack.  Denies any extremity weakness or numbness, no difficulty speaking.

## 2015-07-09 ENCOUNTER — Observation Stay (HOSPITAL_BASED_OUTPATIENT_CLINIC_OR_DEPARTMENT_OTHER): Payer: Managed Care, Other (non HMO)

## 2015-07-09 DIAGNOSIS — G453 Amaurosis fugax: Secondary | ICD-10-CM | POA: Diagnosis not present

## 2015-07-09 DIAGNOSIS — G459 Transient cerebral ischemic attack, unspecified: Secondary | ICD-10-CM

## 2015-07-09 LAB — LIPID PANEL
CHOLESTEROL: 144 mg/dL (ref 0–200)
HDL: 24 mg/dL — ABNORMAL LOW (ref >40)
LDL Cholesterol: 63 mg/dL (ref 0–99)
TRIGLYCERIDES: 285 mg/dL — AB (ref <150)
Total CHOL/HDL Ratio: 6 RATIO
VLDL: 57 mg/dL — ABNORMAL HIGH (ref 0–40)

## 2015-07-09 NOTE — Progress Notes (Signed)
*  PRELIMINARY RESULTS* Echocardiogram 2D Echocardiogram has been performed.  Leavy Cella 07/09/2015, 2:26 PM

## 2015-07-09 NOTE — Progress Notes (Signed)
PT Cancellation Note  Patient Details Name: Shawn Carey MRN: [id number] DOB: 1956-03-16   Cancelled Treatment:    Reason Eval/Treat Not Completed: PT screened, no needs identified, will sign off   Sable Feil  PT 07/09/2015, 12:00 PM [phone]

## 2015-07-09 NOTE — Discharge Summary (Signed)
Physician Discharge Summary  Shawn Carey [id number] DOB: January 07, 1956 DOA: 07/08/2015  PCP: [name], MD  Admit date: 07/08/2015 Discharge date: 07/09/2015  Time spent: 45 minutes  Recommendations for Outpatient Follow-up:  -We'll be discharge home today. -Advised to follow-up with primary care provider in 2 weeks.   Discharge Diagnoses:  Principal Problem:   TIA (transient ischemic attack) Active Problems:   Amaurosis fugax   CAD (coronary artery disease)   Tobacco abuse   HLD (hyperlipidemia)   HTN (hypertension)   Discharge Condition: Stable and improved  Filed Weights   07/08/15 1631  Weight: 90.175 kg (198 lb 12.8 oz)    History of present illness:  Patient is a 60 year old man with history of coronary artery disease status post CABG, hypertension, hyperlipidemia, continued tobacco abuse who was driving to work at [id number] this morning when he noticed all of a sudden he couldn't read the big numbers on his dashboard. He looked up and had double vision. He pulled over to the side of the road. He thinks this resolved after 2 or 3 minutes. He continued and had a second episode. When back to work when he described these episodes to his boss he was sent to the emergency department for evaluation. Code stroke was called. Lab work was essentially unremarkable, toxic screen was negative, CT head did not show acute abnormalities. Tele neurology consult was obtained and they recommended CT angiogram of the head and neck which showed an occluded right vertebral artery with mild intracranial atherosclerosis and mild right PCA stenosis. These findings were discussed by EDP with Dr. [name] who believes no intervention is necessary and has recommended admission for TIA workup.    Hospital Course:   TIA/Amaurosis Fugax -Symptoms have not recurred. -MRI was negative for acute CVA. -Passed PT eval. -ECHO: Left ventricle: The cavity size was normal. Systolic function was normal.  The estimated ejection fraction was in the range of 55% to 60%. Wall motion was normal; there were no regional wall motion abnormalities. Mild concentric and moderate focal basal septal hypertrophy. Diastolic dysfunction, grade indeterminate (likely mild). - Mitral valve: There was mild regurgitation.  -CT Angio Head/Neck: IMPRESSION: 1. Occluded right vertebral artery. Widely patent and dominant left vertebral artery. 2. Mild intracranial atherosclerosis without evidence of flow-limiting proximal stenosis. Mild right P2 PCA stenosis. 3. No cervical carotid artery stenosis.  -Neurology was contacted by EDP regarding findings of CT angio and they believed no intervention was necessary. -ASA for secondary prevention.  CAD -Stable, no CP  HTN -Well controlled  HLD -Continue statin  Procedures:  None   Consultations:  None  Discharge Instructions  Discharge Instructions    Diet - low sodium heart healthy    Complete by:  As directed      Increase activity slowly    Complete by:  As directed             Medication List    TAKE these medications        aspirin EC 81 MG tablet  Take 81 mg by mouth Housand.     atorvastatin 80 MG tablet  Commonly known as:  LIPITOR  Take 1 tablet (80 mg total) by mouth Etherington.     ezetimibe 10 MG tablet  Commonly known as:  ZETIA  Take 1 tablet (10 mg total) by mouth Depp.     metoprolol succinate 25 MG 24 hr tablet  Commonly known as:  TOPROL-XL  Take 1 tablet (25 mg total) by mouth  Schlesinger.       Allergies  Allergen Reactions  . Other Swelling    peas       Follow-up Information    Follow up with [facility], MD. Schedule an appointment as soon as possible for a visit in 2 weeks.   Specialty:  Internal Medicine   Contact information:   [place]  [phone]        The results of significant diagnostics from this hospitalization (including imaging, microbiology, ancillary and  laboratory) are listed below for reference.    Significant Diagnostic Studies: Ct Angio Head W/cm &/or Wo Cm  07/08/2015  CLINICAL DATA:  Blurred/double vision this morning, improved now. EXAM: CT ANGIOGRAPHY HEAD AND NECK TECHNIQUE: Multidetector CT imaging of the head and neck was performed using the standard protocol during bolus administration of intravenous contrast. Multiplanar CT image reconstructions and MIPs were obtained to evaluate the vascular anatomy. Carotid stenosis measurements (when applicable) are obtained utilizing NASCET criteria, using the distal internal carotid diameter as the denominator. CONTRAST:  18mL OMNIPAQUE IOHEXOL 350 MG/ML SOLN COMPARISON:  Noncontrast head CT earlier today FINDINGS: CT HEAD Brain: There is no evidence of acute infarct, intracranial hemorrhage, mass, midline shift, or extra-axial fluid collection on this contrast-enhanced study. Slight cerebral atrophy is noted. Hypodensities in the cerebral white matter are nonspecific but compatible with mild chronic small vessel ischemic disease. Calvarium and skull base: No fracture or destructive osseous lesion. Paranasal sinuses: Clear. Prominent leftward nasal septal deviation noted. Orbits: Unremarkable. CTA NECK Aortic arch: 3 vessel aortic arch. Brachiocephalic and subclavian arteries are widely patent. Right carotid system: Widely patent without evidence of stenosis, dissection, or significant atherosclerosis. Left carotid system: Patent with minimal non stenotic plaque about the carotid bifurcation. Vertebral arteries: The right vertebral artery is occluded at/ near its origin with only trace segmental reconstitution of its distal V2 segment. The left vertebral artery is widely patent and dominant. Skeleton: Booth internal fixation screws in the posterior body/angle of the mandible bilaterally. Other neck: Scarring, centrilobular emphysema, and paraseptal emphysema in the lung apices. CTA HEAD Anterior circulation:  The internal carotid arteries are patent from skullbase to carotid termini without stenosis. ACAs and MCAs are patent with at most mild branch vessel irregularity and without evidence of significant proximal stenosis or major branch vessel occlusion. No intracranial aneurysm is identified. Posterior circulation: Widely patent intracranial vertebral artery on the left. Opacification of small distal right V3 and V4 segments is likely retrograde. PICA and SCA origins are patent. Basilar artery is patent without stenosis. There is a patent posterior communicating artery on the right with the right P1 segment being hypoplastic. No high-grade proximal PCA stenosis is seen, however there is mild right mid P2 stenosis and there is mild diffuse irregularity of the right greater than left PCAs from the P2 segments distally. Venous sinuses: Patent. Anatomic variants: Fetal type origin of the right PCA. Delayed phase: No abnormal enhancement. IMPRESSION: 1. Occluded right vertebral artery. Widely patent and dominant left vertebral artery. 2. Mild intracranial atherosclerosis without evidence of flow-limiting proximal stenosis. Mild right P2 PCA stenosis. 3. No cervical carotid artery stenosis. Electronically Signed   By: Logan Bores M.D.   On: 07/08/2015 14:00   Ct Head Wo Contrast  07/08/2015  CLINICAL DATA:  LEFT side weakness beginning this morning, code stroke, hypertension, coronary artery disease, former smoker EXAM: CT HEAD WITHOUT CONTRAST TECHNIQUE: Contiguous axial images were obtained from the base of the skull through the vertex  without intravenous contrast. COMPARISON:  None FINDINGS: Generalized atrophy. Normal ventricular morphology. No midline shift or mass effect. Small vessel chronic ischemic changes of deep cerebral white matter. No intracranial hemorrhage, mass lesion, or evidence acute infarction. No extra-axial fluid collections. Slight flattening of the LEFT ventral aspect of the brainstem by a  tortuous LEFT vertebral artery compatible with vertebral dolichoectasia. Visualized paranasal sinuses and mastoid air cells clear. No acute osseous findings. IMPRESSION: Atrophy with small vessel chronic ischemic changes of deep cerebral white matter. No acute intracranial abnormalities. Critical Value/emergent results were called by telephone at the time of interpretation on 07/08/2015 at 11:43 am to Dr. Fredia Sorrow , who verbally acknowledged these results. Electronically Signed   By: Lavonia Dana M.D.   On: 07/08/2015 11:43   Ct Angio Neck W/cm &/or Wo/cm  07/08/2015  CLINICAL DATA:  Blurred/double vision this morning, improved now. EXAM: CT ANGIOGRAPHY HEAD AND NECK TECHNIQUE: Multidetector CT imaging of the head and neck was performed using the standard protocol during bolus administration of intravenous contrast. Multiplanar CT image reconstructions and MIPs were obtained to evaluate the vascular anatomy. Carotid stenosis measurements (when applicable) are obtained utilizing NASCET criteria, using the distal internal carotid diameter as the denominator. CONTRAST:  67mL OMNIPAQUE IOHEXOL 350 MG/ML SOLN COMPARISON:  Noncontrast head CT earlier today FINDINGS: CT HEAD Brain: There is no evidence of acute infarct, intracranial hemorrhage, mass, midline shift, or extra-axial fluid collection on this contrast-enhanced study. Slight cerebral atrophy is noted. Hypodensities in the cerebral white matter are nonspecific but compatible with mild chronic small vessel ischemic disease. Calvarium and skull base: No fracture or destructive osseous lesion. Paranasal sinuses: Clear. Prominent leftward nasal septal deviation noted. Orbits: Unremarkable. CTA NECK Aortic arch: 3 vessel aortic arch. Brachiocephalic and subclavian arteries are widely patent. Right carotid system: Widely patent without evidence of stenosis, dissection, or significant atherosclerosis. Left carotid system: Patent with minimal non stenotic plaque  about the carotid bifurcation. Vertebral arteries: The right vertebral artery is occluded at/ near its origin with only trace segmental reconstitution of its distal V2 segment. The left vertebral artery is widely patent and dominant. Skeleton: Booth internal fixation screws in the posterior body/angle of the mandible bilaterally. Other neck: Scarring, centrilobular emphysema, and paraseptal emphysema in the lung apices. CTA HEAD Anterior circulation: The internal carotid arteries are patent from skullbase to carotid termini without stenosis. ACAs and MCAs are patent with at most mild branch vessel irregularity and without evidence of significant proximal stenosis or major branch vessel occlusion. No intracranial aneurysm is identified. Posterior circulation: Widely patent intracranial vertebral artery on the left. Opacification of small distal right V3 and V4 segments is likely retrograde. PICA and SCA origins are patent. Basilar artery is patent without stenosis. There is a patent posterior communicating artery on the right with the right P1 segment being hypoplastic. No high-grade proximal PCA stenosis is seen, however there is mild right mid P2 stenosis and there is mild diffuse irregularity of the right greater than left PCAs from the P2 segments distally. Venous sinuses: Patent. Anatomic variants: Fetal type origin of the right PCA. Delayed phase: No abnormal enhancement. IMPRESSION: 1. Occluded right vertebral artery. Widely patent and dominant left vertebral artery. 2. Mild intracranial atherosclerosis without evidence of flow-limiting proximal stenosis. Mild right P2 PCA stenosis. 3. No cervical carotid artery stenosis. Electronically Signed   By: Logan Bores M.D.   On: 07/08/2015 14:00   Mr Brain Wo Contrast  07/09/2015  CLINICAL DATA:  Initial evaluation  for acute left-sided weakness. EXAM: MRI HEAD WITHOUT CONTRAST TECHNIQUE: Multiplanar, multiecho pulse sequences of the brain and surrounding  structures were obtained without intravenous contrast. COMPARISON:  Prior CT from earlier the same day. FINDINGS: Mild age-related cerebral atrophy present. Patchy T2/FLAIR hyperintensity within the periventricular and deep white matter both cerebral hemispheres most consistent with chronic small vessel ischemic disease, mild nature. No evidence for acute intracranial infarct. Gray-white matter differentiation well maintained. Major intracranial vascular flow voids preserved. No acute intracranial hemorrhage. No areas of chronic infarction. No mass lesion, midline shift, or mass effect. No hydrocephalus. No extra-axial fluid collection. Craniocervical junction normal. Pituitary gland within normal limits. No acute abnormality about the orbits. Paranasal sinuses are clear. No mastoid effusion. Inner ear structures grossly normal. Bone marrow signal intensity within normal limits. No scalp soft tissue abnormality. IMPRESSION: 1. No acute intracranial infarct or other process identified. 2. Mild chronic small vessel ischemic disease. Electronically Signed   By: Jeannine Boga M.D.   On: 07/09/2015 00:35    Microbiology: No results found for this or any previous visit (from the past 240 hour(s)).   Labs: Basic Metabolic Panel:  Recent Labs Lab 07/08/15 1104 07/08/15 1129 07/08/15 1246  NA 137 137  --   K 4.0 5.5* 4.1  CL 104 103  --   CO2 26  --   --   GLUCOSE 89 79  --   BUN 15 21*  --   CREATININE 1.05 1.00  --   CALCIUM 9.1  --   --    Liver Function Tests:  Recent Labs Lab 07/08/15 1104  AST 27  ALT 31  ALKPHOS 109  BILITOT 0.8  PROT 7.2  ALBUMIN 4.0   No results for input(s): LIPASE, AMYLASE in the last 168 hours. No results for input(s): AMMONIA in the last 168 hours. CBC:  Recent Labs Lab 07/08/15 1104 07/08/15 1129  WBC 9.6  --   NEUTROABS 7.1  --   HGB 15.6 16.3  HCT 45.9 48.0  MCV 87.8  --   PLT 226  --    Cardiac Enzymes: No results for input(s):  CKTOTAL, CKMB, CKMBINDEX, TROPONINI in the last 168 hours. BNP: BNP (last 3 results) No results for input(s): BNP in the last 8760 hours.  ProBNP (last 3 results) No results for input(s): PROBNP in the last 8760 hours.  CBG: No results for input(s): GLUCAP in the last 168 hours.     SignedLelon Frohlich  [facility] Pager: [phone] 07/09/2015, 4:01 PM

## 2015-07-09 NOTE — Progress Notes (Signed)
Discharged via wheelchair accompanied by staff for discharge home in care of family.  Stable at discharge. 

## 2015-07-09 NOTE — Progress Notes (Signed)
Central telemetry notified of discharge, telemetry removed.  IV access removed.  Discharge instructions reviewed with patient, questions answered, understanding verbalized.

## 2015-07-10 LAB — HEMOGLOBIN A1C
HEMOGLOBIN A1C: 5.8 % — AB (ref 4.8–5.6)
Mean Plasma Glucose: 120 mg/dL

## 2016-04-18 ENCOUNTER — Other Ambulatory Visit: Payer: Self-pay | Admitting: Cardiovascular Disease

## 2016-04-20 ENCOUNTER — Other Ambulatory Visit: Payer: Self-pay | Admitting: Cardiovascular Disease

## 2016-04-25 ENCOUNTER — Telehealth: Payer: Self-pay | Admitting: Cardiovascular Disease

## 2016-04-25 NOTE — Telephone Encounter (Signed)
ezetimibe (ZETIA) 10 MG tablet   PLEASE send to [facility] [place] said [facility] will not fill it

## 2016-04-25 NOTE — Telephone Encounter (Signed)
PA done through cover my meds.  [facility] [employer])  Awaiting a response.

## 2016-04-26 NOTE — Telephone Encounter (Signed)
Received fax that rx benefits not covered through [facility].  Sent new PA to [facility] per phone number on card.

## 2016-05-04 NOTE — Telephone Encounter (Signed)
Wife Jackelyn Poling) informed still working on this & will let her know when complete.

## 2016-05-09 ENCOUNTER — Emergency Department (HOSPITAL_COMMUNITY)
Admission: EM | Admit: 2016-05-09 | Discharge: 2016-05-09 | Disposition: A | Discharge status: Home / Self Care | Payer: BLUE CROSS/BLUE SHIELD | Attending: Emergency Medicine | Admitting: Emergency Medicine

## 2016-05-09 ENCOUNTER — Encounter (HOSPITAL_COMMUNITY): Payer: Self-pay | Admitting: Emergency Medicine

## 2016-05-09 DIAGNOSIS — Z79899 Other long term (current) drug therapy: Secondary | ICD-10-CM | POA: Diagnosis not present

## 2016-05-09 DIAGNOSIS — Z7982 Long term (current) use of aspirin: Secondary | ICD-10-CM | POA: Insufficient documentation

## 2016-05-09 DIAGNOSIS — N453 Epididymo-orchitis: Secondary | ICD-10-CM | POA: Insufficient documentation

## 2016-05-09 DIAGNOSIS — I10 Essential (primary) hypertension: Secondary | ICD-10-CM | POA: Insufficient documentation

## 2016-05-09 DIAGNOSIS — R32 Unspecified urinary incontinence: Secondary | ICD-10-CM | POA: Diagnosis present

## 2016-05-09 DIAGNOSIS — Z79891 Long term (current) use of opiate analgesic: Secondary | ICD-10-CM | POA: Insufficient documentation

## 2016-05-09 DIAGNOSIS — I2510 Atherosclerotic heart disease of native coronary artery without angina pectoris: Secondary | ICD-10-CM | POA: Diagnosis not present

## 2016-05-09 LAB — URINALYSIS, ROUTINE W REFLEX MICROSCOPIC
Glucose, UA: NEGATIVE mg/dL
Ketones, ur: 15 mg/dL — AB
Nitrite: NEGATIVE
Protein, ur: 30 mg/dL — AB
Specific Gravity, Urine: 1.025 (ref 1.005–1.030)
pH: 6 (ref 5.0–8.0)

## 2016-05-09 LAB — URINE MICROSCOPIC-ADD ON

## 2016-05-09 MED ORDER — LIDOCAINE HCL (PF) 1 % IJ SOLN
INTRAMUSCULAR | Status: AC
  Administered 2016-05-09: 5 mL
  Filled 2016-05-09: qty 5

## 2016-05-09 MED ORDER — CEPHALEXIN 500 MG PO CAPS
500.0000 mg | ORAL_CAPSULE | Freq: Once | ORAL | Status: AC
  Administered 2016-05-09: 500 mg via ORAL
  Filled 2016-05-09: qty 1

## 2016-05-09 MED ORDER — IBUPROFEN 400 MG PO TABS
600.0000 mg | ORAL_TABLET | Freq: Once | ORAL | Status: AC
  Administered 2016-05-09: 600 mg via ORAL
  Filled 2016-05-09: qty 2

## 2016-05-09 MED ORDER — CEFTRIAXONE SODIUM 250 MG IJ SOLR
250.0000 mg | Freq: Once | INTRAMUSCULAR | Status: AC
  Administered 2016-05-09: 250 mg via INTRAMUSCULAR
  Filled 2016-05-09: qty 250

## 2016-05-09 MED ORDER — CIPROFLOXACIN HCL 500 MG PO TABS: 500.0000 mg | ORAL_TABLET | Freq: Two times a day (BID) | ORAL | 0 refills | Status: DC

## 2016-05-09 MED ORDER — AZITHROMYCIN 250 MG PO TABS
1000.0000 mg | ORAL_TABLET | Freq: Once | ORAL | Status: AC
  Administered 2016-05-09: 1000 mg via ORAL
  Filled 2016-05-09: qty 4

## 2016-05-09 MED ORDER — OXYCODONE-ACETAMINOPHEN 5-325 MG PO TABS
2.0000 | ORAL_TABLET | Freq: Once | ORAL | Status: AC
  Administered 2016-05-09: 2 via ORAL
  Filled 2016-05-09: qty 2

## 2016-05-09 NOTE — ED Triage Notes (Signed)
Pt reports groin pain, urinary incontinence and dark colored urine. Onset of symptoms 2 days ago.

## 2016-05-10 NOTE — Telephone Encounter (Signed)
Received message from insurance that his Zetia was declined.    Left message to return call.

## 2016-05-11 LAB — URINE CULTURE: Culture: 60000 — AB

## 2016-05-12 ENCOUNTER — Telehealth (HOSPITAL_COMMUNITY): Payer: Self-pay

## 2016-05-12 NOTE — Telephone Encounter (Signed)
Post ED Visit - Positive Culture Follow-up  Culture report reviewed by antimicrobial stewardship pharmacist:  []  Elenor Quinones, Pharm.D. []  Heide Guile, Pharm.D., BCPS []  Parks Neptune, Pharm.D. []  Alycia Rossetti, Pharm.D., BCPS []  [place], [place].D., BCPS, AAHIVP []  Legrand Como, Pharm.D., BCPS, AAHIVP []  Milus Glazier, Pharm.D. []  Stephens November, Pharm.D. Bjorn Pippin, Pharm.D.   Positive urine culture, 60,000 colonies -> E Coli Treated with Ciprofloxacin, organism sensitive to the same and no further patient follow-up is required at this time.   Dortha Kern 05/12/2016, 9:54 AM

## 2016-05-12 NOTE — Telephone Encounter (Signed)
Left message to return call 

## 2016-05-14 NOTE — ED Provider Notes (Signed)
[place] DEPT Provider Note   CSN: [id number] Arrival date & time: 05/09/16  1100     History   Chief Complaint Chief Complaint  Patient presents with  . Urinary Incontinence    HPI Shawn Carey is a 60 y.o. male.  HPI   60 year old male with 2-3 day history of right groin pain. Gradual onset. Pain has been constant and progressive since onset. Denies any trauma. Pain is worse with movement. Associated with dysuria, dark colored urine and small amount of urinary incontinence. No fevers or chills. No discharge. r testicle is painful but has not noticed any scrotal swelling or erythema.  Past Medical History:  Diagnosis Date  . CAD (coronary artery disease)    NSTEMI/7 vessel CABG, 03/2012  . Heart attack   . HLD (hyperlipidemia)   . HTN (hypertension)   . Pneumothorax   . 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 Tobacco abuse     Patient Active Problem List   Diagnosis Date Noted  . TIA (transient ischemic attack) 07/08/2015  . Amaurosis fugax 07/08/2015  . 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 CAD (coronary artery disease)   . Tobacco abuse   . HLD (hyperlipidemia)   . HTN (hypertension)   . MI, acute, non ST segment elevation ([place]) 03/25/2012    Past Surgical History:  Procedure Laterality Date  . CORONARY ARTERY BYPASS GRAFT  03/27/2012   Procedure: CORONARY ARTERY BYPASS GRAFTING (CABG);  Surgeon: Grace Isaac, MD;  Location: [place];  Service: Open Heart Surgery;  Laterality: N/A;  Coronary Artery Bypass Grafting X 7 Using Left Internal Mammary Artery and Right Saphenous Leg Vein Harvested Endoscopically  . LEFT HEART CATHETERIZATION WITH CORONARY ANGIOGRAM N/A 03/25/2012   Procedure: LEFT HEART CATHETERIZATION WITH CORONARY ANGIOGRAM;  Surgeon: Hillary Bow, MD;  Location: [facility] CATH LAB;  Service: Cardiovascular;  Laterality: N/A;  . THORACOTOMY     about 10 yrs ago  . TONSILLECTOMY         Home Medications     Prior to Admission medications   Medication Sig Start Date End Date Taking? Authorizing Provider  aspirin EC 81 MG tablet Take 81 mg by mouth Deloatch.   Yes Historical Provider, MD  atorvastatin (LIPITOR) 80 MG tablet TAKE ONE TABLET BY MOUTH ONCE Flori 04/18/16  Yes Herminio Commons, MD  DiphenhydrAMINE HCl, Sleep, (ZZZQUIL PO) Take 30 mLs by mouth at bedtime.   Yes Historical Provider, MD  ezetimibe (ZETIA) 10 MG tablet TAKE ONE TABLET BY MOUTH ONCE Auriemma 04/18/16  Yes Herminio Commons, MD  metoprolol succinate (TOPROL-XL) 25 MG 24 hr tablet TAKE ONE TABLET BY MOUTH ONCE Nilsson 04/18/16  Yes Herminio Commons, MD  ciprofloxacin (CIPRO) 500 MG tablet Take 1 tablet (500 mg total) by mouth 2 (two) times Paprocki. 05/09/16   Virgel Manifold, MD    Family History Family History  Problem Relation Age of Onset  . Heart disease Other   . Diabetes Other     Social History Social History  Substance Use Topics  . Smoking status: Former Smoker    Packs/day: 1.50    Years: 30.00    Types: E-cigarettes    Start date: 06/27/1975    Quit date: 03/24/2012  . Smokeless tobacco: Never Used     Comment: currently uses an electric cigarette-has zero nicotine  . Alcohol use No     Allergies   Other   Review of Systems Review of Systems  All  systems reviewed and negative, other than as noted in HPI.   Physical Exam Updated Vital Signs BP 151/92 (BP Location: Left Arm)   Pulse 92   Temp 98.1 F (36.7 C) (Oral)   Resp 20   Ht 6' 1.5" (1.867 m)   Wt 200 lb (90.7 kg)   SpO2 100%   BMI 26.03 kg/m   Physical Exam  Constitutional: He appears well-developed and well-nourished. No distress.  HENT:  Head: Normocephalic and atraumatic.  Eyes: Conjunctivae are normal. Right eye exhibits no discharge. Left eye exhibits no discharge.  Neck: Neck supple.  Cardiovascular: Normal rate, regular rhythm and normal heart sounds.  Exam reveals no gallop and no friction rub.   No murmur  heard. Pulmonary/Chest: Effort normal and breath sounds normal. No respiratory distress.  Abdominal: Soft. He exhibits no distension. There is no tenderness.  Genitourinary:  Genitourinary Comments: R testicle and epididymis very TTP. Normal lie. No swelling. No scrotal or perineal skin changes. Abdominal tenderness suprapubically w/o rebound or guarding.   Musculoskeletal: He exhibits no edema or tenderness.  Neurological: He is alert.  Skin: Skin is warm and dry.  Psychiatric: He has a normal mood and affect. His behavior is normal. Thought content normal.  Nursing note and vitals reviewed.    ED Treatments / Results  Labs (all labs ordered are listed, but only abnormal results are displayed) Labs Reviewed  URINE CULTURE - Abnormal; Notable for the following:       Result Value   Culture 60,000 COLONIES/mL ESCHERICHIA COLI (*)    Organism ID, Bacteria ESCHERICHIA COLI (*)    All other components within normal limits  URINALYSIS, ROUTINE W REFLEX MICROSCOPIC (NOT AT [facility]) - Abnormal; Notable for the following:    APPearance HAZY (*)    Hgb urine dipstick LARGE (*)    Bilirubin Urine SMALL (*)    Ketones, ur 15 (*)    Protein, ur 30 (*)    Leukocytes, UA TRACE (*)    All other components within normal limits  URINE MICROSCOPIC-ADD ON - Abnormal; Notable for the following:    Squamous Epithelial / LPF 0-5 (*)    Bacteria, UA MANY (*)    All other components within normal limits    EKG  EKG Interpretation None       Radiology No results found.  Procedures Procedures (including critical care time)  Medications Ordered in ED Medications  cephALEXin (KEFLEX) capsule 500 mg (500 mg Oral Given 05/09/16 1308)  cefTRIAXone (ROCEPHIN) injection 250 mg (250 mg Intramuscular Given 05/09/16 1418)  azithromycin (ZITHROMAX) tablet 1,000 mg (1,000 mg Oral Given 05/09/16 1417)  oxyCODONE-acetaminophen (PERCOCET/ROXICET) 5-325 MG per tablet 2 tablet (2 tablets Oral Given 05/09/16  1417)  ibuprofen (ADVIL,MOTRIN) tablet 600 mg (600 mg Oral Given 05/09/16 1417)  lidocaine (PF) (XYLOCAINE) 1 % injection (5 mLs  Given 05/09/16 1418)     Initial Impression / Assessment and Plan / ED Course  I have reviewed the triage vital signs and the nursing notes.  Pertinent labs & imaging results that were available during my care of the patient were reviewed by me and considered in my medical decision making (see chart for details).  Clinical Course     60yM with abdominal/testicular pain and urinary symptoms. UA consistent with UTI. Exam concerning for possible epididymo orchitis. No evidence of scrotal/perineal cellulitis. I feel appropriate for outpt tx.   Final Clinical Impressions(s) / ED Diagnoses   Final diagnoses:  Epididymo-orchitis, acute  New Prescriptions Discharge Medication List as of 05/09/2016  1:56 PM    START taking these medications   Details  ciprofloxacin (CIPRO) 500 MG tablet Take 1 tablet (500 mg total) by mouth 2 (two) times [place]., Starting Tue 05/09/2016, Print         Virgel Manifold, MD 05/14/16 1125

## 2016-05-17 MED ORDER — EZETIMIBE 10 MG PO TABS: 10.0000 mg | ORAL_TABLET | Freq: Every day | ORAL | 6 refills | Status: DC

## 2016-05-17 NOTE — Telephone Encounter (Signed)
Wife returned call, asked that we send new rx to [facility].  She will be able to pay out of pocket $45.00 through [facility] for a 30 day supply.

## 2016-05-17 NOTE — Addendum Note (Signed)
Addended by: Laurine Blazer on: 05/17/2016 11:22 AM   Modules accepted: Orders

## 2016-05-17 NOTE — Telephone Encounter (Signed)
Left message to return call.  Will discuss further at [place] on 05/30/2016 with Dr. Bronson Ing.

## 2016-05-30 ENCOUNTER — Ambulatory Visit: Payer: Managed Care, Other (non HMO) | Admitting: Cardiovascular Disease

## 2016-08-10 ENCOUNTER — Ambulatory Visit: Payer: BLUE CROSS/BLUE SHIELD | Admitting: Cardiovascular Disease

## 2016-09-20 ENCOUNTER — Other Ambulatory Visit: Payer: Self-pay | Admitting: Cardiovascular Disease

## 2016-09-21 ENCOUNTER — Other Ambulatory Visit: Payer: Self-pay | Admitting: Cardiovascular Disease

## 2016-10-30 ENCOUNTER — Other Ambulatory Visit: Payer: Self-pay | Admitting: *Deleted

## 2016-10-30 MED ORDER — ATORVASTATIN CALCIUM 80 MG PO TABS: 80.0000 mg | ORAL_TABLET | Freq: Every day | ORAL | 3 refills | Status: DC

## 2016-10-30 MED ORDER — METOPROLOL SUCCINATE ER 25 MG PO TB24: 25.0000 mg | ORAL_TABLET | Freq: Every day | ORAL | 3 refills | Status: DC

## 2016-10-30 MED ORDER — EZETIMIBE 10 MG PO TABS: 10.0000 mg | ORAL_TABLET | Freq: Every day | ORAL | 3 refills | Status: DC

## 2016-11-03 ENCOUNTER — Ambulatory Visit (INDEPENDENT_AMBULATORY_CARE_PROVIDER_SITE_OTHER): Payer: BLUE CROSS/BLUE SHIELD | Admitting: Cardiovascular Disease

## 2016-11-03 ENCOUNTER — Other Ambulatory Visit: Payer: Self-pay | Admitting: Cardiovascular Disease

## 2016-11-03 ENCOUNTER — Encounter: Payer: Self-pay | Admitting: *Deleted

## 2016-11-03 ENCOUNTER — Encounter: Payer: Self-pay | Admitting: Cardiovascular Disease

## 2016-11-03 VITALS — BP 116/68 | HR 66 | Ht 73.0 in | Wt 205.0 lb

## 2016-11-03 DIAGNOSIS — I10 Essential (primary) hypertension: Secondary | ICD-10-CM

## 2016-11-03 DIAGNOSIS — E785 Hyperlipidemia, unspecified: Secondary | ICD-10-CM | POA: Diagnosis not present

## 2016-11-03 DIAGNOSIS — I2510 Atherosclerotic heart disease of native coronary artery without angina pectoris: Secondary | ICD-10-CM | POA: Diagnosis not present

## 2016-11-03 DIAGNOSIS — Z951 Presence of aortocoronary bypass graft: Secondary | ICD-10-CM | POA: Diagnosis not present

## 2016-11-03 DIAGNOSIS — Z8673 Personal history of transient ischemic attack (TIA), and cerebral infarction without residual deficits: Secondary | ICD-10-CM

## 2016-11-03 MED ORDER — NITROGLYCERIN 0.4 MG SL SUBL: 0.4000 mg | SUBLINGUAL_TABLET | SUBLINGUAL | 3 refills | Status: DC | PRN

## 2016-11-03 NOTE — Progress Notes (Signed)
SUBJECTIVE: The patient presents for past due follow-up of coronary artery disease. He underwent 7 vessel CABG in October 2013. I last saw him nearly 2 years ago on 12/14/14.  He was hospitalized for a TIA in 06/2015.  Echocardiogram at that time demonstrated normal left ventricular systolic function, LVEF 53-66%, normal regional wall motion, and mild diastolic dysfunction.  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  He denies chest pain and tightness. He is very active at work and does a lot of walking. He has a prior history of over 30 years of tobacco use. He has had shortness of breath ever since his CABG, particularly when climbing stairs or carrying something heavy and walking.  He said symptoms prior to CABG or dysphagia where he felt things were getting stuck in his throat. These symptoms resolved after bypass surgery.  He said he rarely goes to see his PCP.  ECG performed in the office today which I ordered and personally interpreted demonstrates normal sinus rhythm with no ischemic ST segment or T-wave abnormalities, nor any arrhythmias.    Review of Systems: As per "subjective", otherwise negative.  Allergies  Allergen Reactions  . Other Swelling    peas    Current Outpatient Prescriptions  Medication Sig Dispense Refill  . aspirin EC 81 MG tablet Take 81 mg by mouth Hickam.    Marland Kitchen atorvastatin (LIPITOR) 80 MG tablet Take 1 tablet (80 mg total) by mouth Burkitt. 90 tablet 3  . ezetimibe (ZETIA) 10 MG tablet Take 1 tablet (10 mg total) by mouth Gonser. 90 tablet 3  . metoprolol succinate (TOPROL-XL) 25 MG 24 hr tablet Take 1 tablet (25 mg total) by mouth Shearer. 90 tablet 3   No current facility-administered medications for this visit.     Past Medical History:  Diagnosis Date  . CAD (coronary artery disease)    NSTEMI/7 vessel CABG, 03/2012  . Heart attack ([place])   . HLD (hyperlipidemia)   . HTN (hypertension)   . Pneumothorax     . 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 Tobacco abuse     Past Surgical History:  Procedure Laterality Date  . CORONARY ARTERY BYPASS GRAFT  03/27/2012   Procedure: CORONARY ARTERY BYPASS GRAFTING (CABG);  Surgeon: Grace Isaac, MD;  Location: [place];  Service: Open Heart Surgery;  Laterality: N/A;  Coronary Artery Bypass Grafting X 7 Using Left Internal Mammary Artery and Right Saphenous Leg Vein Harvested Endoscopically  . LEFT HEART CATHETERIZATION WITH CORONARY ANGIOGRAM N/A 03/25/2012   Procedure: LEFT HEART CATHETERIZATION WITH CORONARY ANGIOGRAM;  Surgeon: Hillary Bow, MD;  Location: [facility] CATH LAB;  Service: Cardiovascular;  Laterality: N/A;  . THORACOTOMY     about 10 yrs ago  . TONSILLECTOMY      Social History   Social History  . Marital status: Married    Spouse name: N/A  . Number of children: N/A  . Years of education: N/A   Occupational History  . Not on file.   Social History Main Topics  . Smoking status: Former Smoker    Packs/day: 1.50    Years: 30.00    Types: E-cigarettes    Start date: 06/27/1975    Quit date: 03/24/2012  . Smokeless tobacco: Never Used     Comment: currently uses an electric cigarette-has zero nicotine  . Alcohol use No  . Drug use: No  . Sexual activity: Yes  Birth control/ protection: None   Other Topics Concern  . Not on file   Social History Narrative  . No narrative on file     Vitals:   11/03/16 1441  BP: 116/68  Pulse: 66  SpO2: 97%  Weight: 205 lb (93 kg)  Height: 6\' 1"  (1.854 m)    Wt Readings from Last 3 Encounters:  11/03/16 205 lb (93 kg)  05/09/16 200 lb (90.7 kg)  07/08/15 198 lb 12.8 oz (90.2 kg)     PHYSICAL EXAM General: NAD HEENT: Normal. Neck: No JVD, no thyromegaly. Lungs: Clear to auscultation bilaterally with normal respiratory effort. CV: Nondisplaced PMI.  Regular rate and rhythm, normal S1/S2, no S3/S4, no murmur. No pretibial or periankle edema.  No carotid  bruit.   Abdomen: Soft, nontender, no distention.  Neurologic: Alert and oriented.  Psych: Normal affect. Skin: Normal. Musculoskeletal: No gross deformities.    ECG: Most recent ECG reviewed.   Labs: Lab Results  Component Value Date/Time   K 4.1 07/08/2015 12:46 PM   BUN 21 (H) 07/08/2015 11:29 AM   CREATININE 1.00 07/08/2015 11:29 AM   ALT 31 07/08/2015 11:04 AM   HGB 16.3 07/08/2015 11:29 AM     Lipids: Lab Results  Component Value Date/Time   LDLCALC 63 07/09/2015 06:30 AM   CHOL 144 07/09/2015 06:30 AM   TRIG 285 (H) 07/09/2015 06:30 AM   HDL 24 (L) 07/09/2015 06:30 AM       ASSESSMENT AND PLAN: 1. Coronary artery disease with history of 7 vessel CABG in October 2013: Symptomatically stable. I will provide a prescription for nitroglycerin. Continue aspirin, Lipitor, and metoprolol.  2. Hypertension: Controlled. No changes to therapy.  3. Hyperlipidemia: I will check lipids. Continue Lipitor and Zetia.  4. History of TIA: Continue aspirin. Blood pressure is normal.    Disposition: Follow up 1 yr  Kate Sable, M.D., F.A.C.C.

## 2016-11-03 NOTE — Patient Instructions (Signed)
Your physician wants you to follow-up in: [place] will receive a reminder letter in the mail two months in advance. If you don't receive a letter, please call our office to schedule the follow-up appointment.  Your physician has recommended you make the following change in your medication:   NITROGLYCERIN 0.4 MG UNDER THE TONGUE EVERY 5 MINUTES FOR CHEST PAIN   Your physician recommends that you return for lab work LIPIDS - PLEASE FAST PRIOR TO LAB WORK  Thank you for choosing [facility]!!   Nitroglycerin sublingual tablets What is this medicine? NITROGLYCERIN (nye troe GLI ser in) is a type of vasodilator. It relaxes blood vessels, increasing the blood and oxygen supply to your heart. This medicine is used to relieve chest pain caused by angina. It is also used to prevent chest pain before activities like climbing stairs, going outdoors in cold weather, or sexual activity. This medicine may be used for other purposes; ask your health care provider or pharmacist if you have questions. COMMON BRAND NAME(S): Nitroquick, Nitrostat, Nitrotab What should I tell my health care provider before I take this medicine? They need to know if you have any of these conditions: -anemia -head injury, recent stroke, or bleeding in the brain -liver disease -previous heart attack -an unusual or allergic reaction to nitroglycerin, other medicines, foods, dyes, or preservatives -pregnant or trying to get pregnant -breast-feeding How should I use this medicine? Take this medicine by mouth as needed. At the first sign of an angina attack (chest pain or tightness) place one tablet under your tongue. You can also take this medicine 5 to 10 minutes before an event likely to produce chest pain. Follow the directions on the prescription label. Let the tablet dissolve under the tongue. Do not swallow whole. Replace the dose if you accidentally swallow it. It will help if your mouth is  not dry. Saliva around the tablet will help it to dissolve more quickly. Do not eat or drink, smoke or chew tobacco while a tablet is dissolving. If you are not better within 5 minutes after taking ONE dose of nitroglycerin, call 9-1-1 immediately to seek emergency medical care. Do not take more than 3 nitroglycerin tablets over 15 minutes. If you take this medicine often to relieve symptoms of angina, your doctor or health care professional may provide you with different instructions to manage your symptoms. If symptoms do not go away after following these instructions, it is important to call 9-1-1 immediately. Do not take more than 3 nitroglycerin tablets over 15 minutes. Talk to your pediatrician regarding the use of this medicine in children. Special care may be needed. Overdosage: If you think you have taken too much of this medicine contact a poison control center or emergency room at once. NOTE: This medicine is only for you. Do not share this medicine with others. What if I miss a dose? This does not apply. This medicine is only used as needed. What may interact with this medicine? Do not take this medicine with any of the following medications: -certain migraine medicines like ergotamine and dihydroergotamine (DHE) -medicines used to treat erectile dysfunction like sildenafil, tadalafil, and vardenafil -riociguat This medicine may also interact with the following medications: -alteplase -aspirin -heparin -medicines for high blood pressure -medicines for mental depression -other medicines used to treat angina -phenothiazines like chlorpromazine, mesoridazine, prochlorperazine, thioridazine This list may not describe all possible interactions. Give your health care provider a list of all the medicines,  herbs, non-prescription drugs, or dietary supplements you use. Also tell them if you smoke, drink alcohol, or use illegal drugs. Some items may interact with your medicine. What should I  watch for while using this medicine? Tell your doctor or health care professional if you feel your medicine is no longer working. Keep this medicine with you at all times. Sit or lie down when you take your medicine to prevent falling if you feel dizzy or faint after using it. Try to remain calm. This will help you to feel better faster. If you feel dizzy, take several deep breaths and lie down with your feet propped up, or bend forward with your head resting between your knees. You may get drowsy or dizzy. Do not drive, use machinery, or do anything that needs mental alertness until you know how this drug affects you. Do not stand or sit up quickly, especially if you are an older patient. This reduces the risk of dizzy or fainting spells. Alcohol can make you more drowsy and dizzy. Avoid alcoholic drinks. Do not treat yourself for coughs, colds, or pain while you are taking this medicine without asking your doctor or health care professional for advice. Some ingredients may increase your blood pressure. What side effects may I notice from receiving this medicine? Side effects that you should report to your doctor or health care professional as soon as possible: -blurred vision -dry mouth -skin rash -sweating -the feeling of extreme pressure in the head -unusually weak or tired Side effects that usually do not require medical attention (report to your doctor or health care professional if they continue or are bothersome): -flushing of the face or neck -headache -irregular heartbeat, palpitations -nausea, vomiting This list may not describe all possible side effects. Call your doctor for medical advice about side effects. You may report side effects to FDA at 1-800-FDA-1088. Where should I keep my medicine? Keep out of the reach of children. Store at room temperature between 20 and 25 degrees C (68 and 77 degrees F). Store in [facility] of Staff. Protect from light and moisture. Keep tightly  closed. Throw away any unused medicine after the expiration date. NOTE: This sheet is a summary. It may not cover all possible information. If you have questions about this medicine, talk to your doctor, pharmacist, or health care provider.  2018 Elsevier/Gold Standard (2013-04-10 17:57:36)

## 2016-11-04 LAB — LIPID PANEL W/O CHOL/HDL RATIO
CHOLESTEROL TOTAL: 158 mg/dL (ref 100–199)
HDL: 29 mg/dL — ABNORMAL LOW (ref >39)
LDL Calculated: 78 mg/dL (ref 0–99)
Triglycerides: 256 mg/dL — ABNORMAL HIGH (ref 0–149)
VLDL Cholesterol Cal: 51 mg/dL — ABNORMAL HIGH (ref 5–40)

## 2016-11-06 ENCOUNTER — Telehealth: Payer: Self-pay | Admitting: *Deleted

## 2016-11-06 NOTE — Telephone Encounter (Signed)
Notes recorded by Laurine Blazer, LPN on 9/45/0388 at 8:28 PM EDT Left message to return call.  ------  Notes recorded by Herminio Commons, MD on 11/04/2016 at 2:49 PM EDT TC still elevated. LDL fairly good. Please speak to him about dietary modification to reduce cholesterol.

## 2016-11-06 NOTE — Telephone Encounter (Signed)
Notes recorded by Laurine Blazer, LPN on 5/69/7948 at 0:16 PM EDT Patient notified. Copy to pmd. Cholesterol info mailed today. ------

## 2017-11-16 ENCOUNTER — Other Ambulatory Visit: Payer: Self-pay | Admitting: Cardiovascular Disease

## 2017-11-20 ENCOUNTER — Other Ambulatory Visit: Payer: Self-pay | Admitting: Cardiovascular Disease

## 2018-03-21 ENCOUNTER — Other Ambulatory Visit: Payer: Self-pay | Admitting: Cardiovascular Disease

## 2018-06-04 ENCOUNTER — Telehealth: Payer: Self-pay | Admitting: Cardiovascular Disease

## 2018-06-04 NOTE — Telephone Encounter (Signed)
Pt aware that bleeding risks are associated with taking Aspirin or any other similar type of medication and says that if he should notice any abnormal bleeding to contact us - pt voiced understanding

## 2018-06-04 NOTE — Telephone Encounter (Signed)
Patient saw on the News this morning that taking baby aspirin or aspirin will cause bleeding on the brain and patients should not take.  He would like to know if he should stop taking.

## 2018-06-26 HISTORY — PX: TUMOR REMOVAL: SHX12

## 2018-07-11 ENCOUNTER — Other Ambulatory Visit: Payer: Self-pay | Admitting: Cardiovascular Disease

## 2018-07-19 ENCOUNTER — Encounter: Payer: Self-pay | Admitting: *Deleted

## 2018-07-19 ENCOUNTER — Telehealth: Payer: Self-pay | Admitting: *Deleted

## 2018-07-19 NOTE — Telephone Encounter (Signed)
Fax received from [facility] / BS - Ezetimibe (Zetia) approved 07/19/2018 until 07/19/2019.

## 2018-08-26 ENCOUNTER — Other Ambulatory Visit: Payer: Self-pay | Admitting: Cardiovascular Disease

## 2018-09-26 ENCOUNTER — Telehealth: Payer: Self-pay | Admitting: *Deleted

## 2018-09-26 NOTE — Telephone Encounter (Signed)
   Cardiac Questionnaire:    Since your last visit or hospitalization:    1. Have you been having new or worsening chest pain? No   2. Have you been having new or worsening shortness of breath? No 3. Have you been having new or worsening leg swelling, wt gain, or increase in abdominal girth (pants fitting more tightly)? No   4. Have you had any passing out spells? No    Given the current situation regarding the worldwide coronarvirus pandemic, at the recommendation of the CDC, we are looking to limit gatherings in our waiting area, and thus will schedule their appointment as a virtual visit.

## 2018-09-30 ENCOUNTER — Encounter: Payer: Self-pay | Admitting: Cardiovascular Disease

## 2018-09-30 ENCOUNTER — Telehealth: Payer: BLUE CROSS/BLUE SHIELD | Admitting: Cardiovascular Disease

## 2018-09-30 ENCOUNTER — Telehealth (INDEPENDENT_AMBULATORY_CARE_PROVIDER_SITE_OTHER): Payer: BLUE CROSS/BLUE SHIELD | Admitting: Cardiovascular Disease

## 2018-09-30 VITALS — BP 132/74 | HR 65 | Temp 96.9°F | Ht 73.0 in | Wt 210.0 lb

## 2018-09-30 DIAGNOSIS — I10 Essential (primary) hypertension: Secondary | ICD-10-CM

## 2018-09-30 DIAGNOSIS — E785 Hyperlipidemia, unspecified: Secondary | ICD-10-CM

## 2018-09-30 DIAGNOSIS — R1310 Dysphagia, unspecified: Secondary | ICD-10-CM

## 2018-09-30 DIAGNOSIS — I25708 Atherosclerosis of coronary artery bypass graft(s), unspecified, with other forms of angina pectoris: Secondary | ICD-10-CM | POA: Diagnosis not present

## 2018-09-30 DIAGNOSIS — Z8673 Personal history of transient ischemic attack (TIA), and cerebral infarction without residual deficits: Secondary | ICD-10-CM

## 2018-09-30 DIAGNOSIS — R1319 Other dysphagia: Secondary | ICD-10-CM

## 2018-09-30 DIAGNOSIS — K219 Gastro-esophageal reflux disease without esophagitis: Secondary | ICD-10-CM

## 2018-09-30 MED ORDER — PANTOPRAZOLE SODIUM 40 MG PO TBEC: 40.0000 mg | DELAYED_RELEASE_TABLET | Freq: Every day | ORAL | 11 refills | Status: DC

## 2018-09-30 NOTE — Addendum Note (Signed)
Addended by: Debbora Lacrosse R on: 09/30/2018 01:35 PM   Modules accepted: Orders

## 2018-09-30 NOTE — Progress Notes (Signed)
Virtual Visit via Phone Note   This visit type was conducted due to national recommendations for restrictions regarding the COVID-19 Pandemic (e.g. social distancing) in an effort to limit this patient's exposure and mitigate transmission in our community.  Due to his co-morbid illnesses, this patient is at least at moderate risk for complications without adequate follow up.  This format is felt to be most appropriate for this patient at this time.  All issues noted in this document were discussed and addressed.  A limited physical exam was performed with this format.  Please refer to the patient's chart for his consent to telehealth for [facility].   Evaluation Performed:  Follow-up visit  Date:  09/30/2018   ID:  Shawn Carey, DOB [birth year], MRN [id number]  Patient Location: Home  Provider Location: Home  PCP:  [name], MD  Cardiologist:  Kate Sable, MD  Electrophysiologist:  None   Chief Complaint:  CAD  History of Present Illness:    Shawn Carey is a 63 y.o. male who presents via audio/video conferencing for a telehealth visit today.    He underwent 7 vessel CABG in October 2013. I last saw him nearly 2 years ago on 12/14/14.  He was hospitalized for a TIA in 06/2015.  Echocardiogram at that time demonstrated normal left ventricular systolic function, LVEF 24-09%, normal regional wall motion, and mild diastolic dysfunction.  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  I last evaluated him in May 2018. He does not see a PCP.  When he eats, the first 2-3 bites cause belching or vomiting. Food gets stuck in his throat. He only has GERD when he eats spicy foods (pizza, spaghetti).  He works in a [employer]. After 3 flights, he gets short of breath but this has been chronic since CABG. He denies exertional chest pain. He denies lightheadedness, dizziness, palpitations, and syncope. His feet swell at the end of a long day  as he is on his feet for 8 hours straight.   He has not had to use nitro since his last visit with me.  The patient does not have symptoms concerning for COVID-19 infection (fever, chills, cough, or new shortness of breath).    Past Medical History:  Diagnosis Date  . CAD (coronary artery disease)    NSTEMI/7 vessel CABG, 03/2012  . Heart attack ([place])   . HLD (hyperlipidemia)   . HTN (hypertension)   . Pneumothorax   . 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 Tobacco abuse    Past Surgical History:  Procedure Laterality Date  . CORONARY ARTERY BYPASS GRAFT  03/27/2012   Procedure: CORONARY ARTERY BYPASS GRAFTING (CABG);  Surgeon: Grace Isaac, MD;  Location: [place];  Service: Open Heart Surgery;  Laterality: N/A;  Coronary Artery Bypass Grafting X 7 Using Left Internal Mammary Artery and Right Saphenous Leg Vein Harvested Endoscopically  . LEFT HEART CATHETERIZATION WITH CORONARY ANGIOGRAM N/A 03/25/2012   Procedure: LEFT HEART CATHETERIZATION WITH CORONARY ANGIOGRAM;  Surgeon: Hillary Bow, MD;  [facility] C[facility];  Service: Cardiovascular;  Laterality: N/A;  . THORACOTOMY     about 10 yrs ago  . TONSILLECTOMY       Current Meds  Medication Sig  . aspirin EC 81 MG tablet Take 81 mg by mouth Safi.  Marland Kitchen atorvastatin (LIPITOR) 80 MG tablet TAKE 1 TABLET BY MOUTH ONCE Karrer NEED  OFFICE  VISIT  . ezetimibe (ZETIA) 10  MG tablet Take 1 tablet by mouth once Trant  . metoprolol succinate (TOPROL-XL) 25 MG 24 hr tablet TAKE 1 TABLET BY MOUTH ONCE Critz NEED  OFFICE  VISIT     Allergies:   Other   Social History   Tobacco Use  . Smoking status: Former Smoker    Packs/day: 1.50    Years: 30.00    Pack years: 45.00    Types: E-cigarettes    Start date: 06/27/1975    Last attempt to quit: 03/24/2012    Years since quitting: 6.5  . Smokeless tobacco: Never Used  . Tobacco comment: currently uses an electric cigarette-has zero nicotine  Substance Use Topics  .  Alcohol use: No    Alcohol/week: 0.0 standard drinks  . Drug use: No     Family Hx: The patient's family history includes Diabetes in an other family member; Heart disease in an other family member.  ROS:   Please see the history of present illness.     All other systems reviewed and are negative.   Prior CV studies:   The following studies were reviewed today:  See above  Labs/Other Tests and Data Reviewed:      Recent Labs: No results found for requested labs within last 8760 hours.   Recent Lipid Panel Lab Results  Component Value Date/Time   CHOL 158 11/03/2016 03:40 PM   TRIG 256 (H) 11/03/2016 03:40 PM   HDL 29 (L) 11/03/2016 03:40 PM   CHOLHDL 6.0 07/09/2015 06:30 AM   LDLCALC 78 11/03/2016 03:40 PM    Wt Readings from Last 3 Encounters:  09/30/18 210 lb (95.3 kg)  11/03/16 205 lb (93 kg)  05/09/16 200 lb (90.7 kg)     Objective:    Vital Signs:  BP 132/74   Pulse 65   Temp (!) 96.9 F (36.1 C)   Ht 6\' 1"  (1.854 m)   Wt 210 lb (95.3 kg)   SpO2 97%   BMI 27.71 kg/m    Phone visit  ASSESSMENT & PLAN:    1. Coronary artery disease with history of 7 vessel CABG in October 2013: Symptomatically stable. Continue aspirin, Lipitor, and metoprolol. I will check a CBC and BMET as he has had no recent labs and does not follow with a PCP.  2. Hypertension: Controlled. No changes to therapy.  3. Hyperlipidemia: I will check lipids. Continue Lipitor and Zetia.  4. History of TIA: Continue aspirin. Blood pressure is normal.  5. Dysphagia for solids: I will make a GI referral. He may need a barium swallow vs EGD. He appears to have GERD symptoms for which I will prescribe Protonix 40 mg Bowsher.  6. GERD: I will prescribe Protonix 40 mg Colberg.    COVID-19 Education: The signs and symptoms of COVID-19 were discussed with the patient and how to seek care for testing (follow up with PCP or arrange E-visit).  The importance of social distancing was  discussed today.  Time:   Today, I have spent 30 minutes with the patient with telehealth technology discussing the above problems.     Medication Adjustments/Labs and Tests Ordered: Current medicines are reviewed at length with the patient today.  Concerns regarding medicines are outlined above.  Tests Ordered: No orders of the defined types were placed in this encounter.  Medication Changes: No orders of the defined types were placed in this encounter.   Disposition:  Follow up in 1 year(s)  Signed, Kate Sable, MD  09/30/2018 1:21  PM    [facility]

## 2018-09-30 NOTE — Progress Notes (Signed)
Medication Instructions:  Start protonix 40 mg Mullinix   Labwork: Cbc bmet  Testing/Procedures: none  Follow-Up: Your physician wants you to follow-up in: 1 year. You will receive a reminder letter in the mail two months in advance. If you don't receive a letter, please call our office to schedule the follow-up appointment.  Any Other Special Instructions Will Be Listed Below (If Applicable).  You have been referred to Dr. Laural Golden.   If you need a refill on your cardiac medications before your next appointment, please call your pharmacy.

## 2018-10-01 NOTE — Progress Notes (Signed)
A video visit was attempted but the patient had technical difficulties.

## 2018-10-02 ENCOUNTER — Telehealth: Payer: BLUE CROSS/BLUE SHIELD | Admitting: Cardiovascular Disease

## 2018-10-03 ENCOUNTER — Ambulatory Visit (INDEPENDENT_AMBULATORY_CARE_PROVIDER_SITE_OTHER): Payer: BLUE CROSS/BLUE SHIELD | Admitting: Internal Medicine

## 2018-10-03 ENCOUNTER — Other Ambulatory Visit: Payer: Self-pay

## 2018-10-03 ENCOUNTER — Encounter (INDEPENDENT_AMBULATORY_CARE_PROVIDER_SITE_OTHER): Payer: Self-pay | Admitting: Internal Medicine

## 2018-10-03 VITALS — BP 138/84 | HR 59 | Temp 97.9°F | Ht 73.0 in | Wt 209.7 lb

## 2018-10-03 DIAGNOSIS — R1319 Other dysphagia: Secondary | ICD-10-CM

## 2018-10-03 DIAGNOSIS — R1310 Dysphagia, unspecified: Secondary | ICD-10-CM | POA: Diagnosis not present

## 2018-10-03 NOTE — Patient Instructions (Signed)
DG esophagram.   

## 2018-10-03 NOTE — Progress Notes (Signed)
   Subjective:    Patient ID: Shawn Carey, male    DOB: 06-27-1955, 63 y.o.   MRN: [id number]  HPI Referred by Dr. Bronson Ing for dysphagia. Symptoms for about 6 months. Spicy foods, hamburgers are slow to go down. The first 3 bites are the worse and then the dysphagia will resolved. Denies prior symptoms. States he eats fast. Appetite is okay. No weight loss.  His BMs are normal. No melena or BRRB  Hx of CABG, TIA, HTN, CAD, hypertension, high cholesterol. Review of Systems Past Medical History:  Diagnosis Date  . CAD (coronary artery disease)    NSTEMI/7 vessel CABG, 03/2012  . Heart attack ([place])   . HLD (hyperlipidemia)   . HTN (hypertension)   . Pneumothorax   . 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 Tobacco abuse     Past Surgical History:  Procedure Laterality Date  . CORONARY ARTERY BYPASS GRAFT  03/27/2012   Procedure: CORONARY ARTERY BYPASS GRAFTING (CABG);  Surgeon: Grace Isaac, MD;  Location: [place];  Service: Open Heart Surgery;  Laterality: N/A;  Coronary Artery Bypass Grafting X 7 Using Left Internal Mammary Artery and Right Saphenous Leg Vein Harvested Endoscopically  . LEFT HEART CATHETERIZATION WITH CORONARY ANGIOGRAM N/A 03/25/2012   Procedure: LEFT HEART CATHETERIZATION WITH CORONARY ANGIOGRAM;  Surgeon: Hillary Bow, MD;  Location: [facility] CATH LAB;  Service: Cardiovascular;  Laterality: N/A;  . THORACOTOMY     about 10 yrs ago  . TONSILLECTOMY      Allergies  Allergen Reactions  . Other Swelling    peas    Current Outpatient Medications on File Prior to Visit  Medication Sig Dispense Refill  . aspirin EC 81 MG tablet Take 81 mg by mouth Savastano.    Marland Kitchen atorvastatin (LIPITOR) 80 MG tablet TAKE 1 TABLET BY MOUTH ONCE Klett NEED  OFFICE  VISIT 30 tablet 1  . ezetimibe (ZETIA) 10 MG tablet Take 1 tablet by mouth once Ohmann 30 tablet 1  . metoprolol succinate (TOPROL-XL) 25 MG 24 hr tablet TAKE 1 TABLET BY MOUTH ONCE Authement NEED  OFFICE   VISIT 30 tablet 1  . pantoprazole (PROTONIX) 40 MG tablet Take 1 tablet (40 mg total) by mouth Lorson. 30 tablet 11  . nitroGLYCERIN (NITROSTAT) 0.4 MG SL tablet Place 1 tablet (0.4 mg total) under the tongue every 5 (five) minutes as needed for chest pain. 25 tablet 3   No current facility-administered medications on file prior to visit.         Objective:   Physical Exam Blood pressure 138/84, pulse (!) 59, temperature 97.9 F (36.6 C), height 6\' 1"  (1.854 m), weight 209 lb 11.2 oz (95.1 kg). Alert and oriented. Skin warm and dry. Oral mucosa is moist.   . Sclera anicteric, conjunctivae is pink. Thyroid not enlarged. No cervical lymphadenopathy. Lungs clear. Heart regular rate and rhythm.  Abdomen is soft. Bowel sounds are positive. No hepatomegaly. No abdominal masses felt. No tenderness.  No edema to lower extremities. Patient is alert and oriented.        Assessment & Plan:  Dysphagia. Am going to get an DG esophagram. Further recommendations to follow. Pick up Rx for Protonix.

## 2018-10-07 ENCOUNTER — Encounter (INDEPENDENT_AMBULATORY_CARE_PROVIDER_SITE_OTHER): Payer: Self-pay | Admitting: *Deleted

## 2018-11-09 ENCOUNTER — Other Ambulatory Visit: Payer: Self-pay | Admitting: Cardiovascular Disease

## 2018-11-25 ENCOUNTER — Other Ambulatory Visit: Payer: Self-pay

## 2018-11-25 ENCOUNTER — Ambulatory Visit (HOSPITAL_COMMUNITY)
Admission: RE | Admit: 2018-11-25 | Discharge: 2018-11-25 | Disposition: A | Discharge status: Home / Self Care | Payer: BLUE CROSS/BLUE SHIELD | Source: Ambulatory Visit | Attending: Internal Medicine | Admitting: Internal Medicine

## 2018-11-25 DIAGNOSIS — R1310 Dysphagia, unspecified: Secondary | ICD-10-CM | POA: Diagnosis present

## 2018-11-25 DIAGNOSIS — R1319 Other dysphagia: Secondary | ICD-10-CM

## 2018-11-27 ENCOUNTER — Other Ambulatory Visit (INDEPENDENT_AMBULATORY_CARE_PROVIDER_SITE_OTHER): Payer: Self-pay | Admitting: Internal Medicine

## 2018-11-27 DIAGNOSIS — R1319 Other dysphagia: Secondary | ICD-10-CM

## 2018-11-27 DIAGNOSIS — R1310 Dysphagia, unspecified: Secondary | ICD-10-CM

## 2018-11-28 ENCOUNTER — Other Ambulatory Visit (HOSPITAL_COMMUNITY): Payer: Self-pay | Admitting: Specialist

## 2018-11-28 DIAGNOSIS — R1319 Other dysphagia: Secondary | ICD-10-CM

## 2018-11-29 ENCOUNTER — Telehealth (HOSPITAL_COMMUNITY): Payer: Self-pay | Admitting: General Practice

## 2018-11-29 NOTE — Telephone Encounter (Signed)
11/29/18  pt said he had an interview on this day and couldn't do... wants to RS for 6/17

## 2018-12-03 ENCOUNTER — Other Ambulatory Visit (HOSPITAL_COMMUNITY): Payer: Self-pay | Admitting: Specialist

## 2018-12-03 DIAGNOSIS — R1319 Other dysphagia: Secondary | ICD-10-CM

## 2018-12-04 ENCOUNTER — Telehealth (HOSPITAL_COMMUNITY): Payer: Self-pay | Admitting: General Practice

## 2018-12-04 NOTE — Telephone Encounter (Signed)
12/04/18  I called and left patient a message to inform of rescheduled MBSS date and time and if he had any questions to call our office.

## 2018-12-05 ENCOUNTER — Other Ambulatory Visit (HOSPITAL_COMMUNITY): Payer: BLUE CROSS/BLUE SHIELD

## 2018-12-05 ENCOUNTER — Ambulatory Visit (HOSPITAL_COMMUNITY): Payer: BLUE CROSS/BLUE SHIELD | Admitting: Speech Pathology

## 2018-12-25 ENCOUNTER — Other Ambulatory Visit: Payer: Self-pay

## 2018-12-25 ENCOUNTER — Ambulatory Visit (HOSPITAL_COMMUNITY): Payer: BC Managed Care – PPO | Attending: Internal Medicine | Admitting: Speech Pathology

## 2018-12-25 ENCOUNTER — Encounter (HOSPITAL_COMMUNITY): Payer: Self-pay | Admitting: Speech Pathology

## 2018-12-25 ENCOUNTER — Ambulatory Visit (HOSPITAL_COMMUNITY)
Admission: RE | Admit: 2018-12-25 | Discharge: 2018-12-25 | Disposition: A | Discharge status: Home / Self Care | Payer: BC Managed Care – PPO | Source: Ambulatory Visit | Attending: Internal Medicine | Admitting: Internal Medicine

## 2018-12-25 DIAGNOSIS — R1312 Dysphagia, oropharyngeal phase: Secondary | ICD-10-CM | POA: Diagnosis not present

## 2018-12-25 DIAGNOSIS — R1319 Other dysphagia: Secondary | ICD-10-CM | POA: Insufficient documentation

## 2018-12-25 NOTE — Therapy (Signed)
[place], [place], [address] Phone: [phone]   Fax:  [phone]  Modified Barium Swallow  Patient Details  Name: Shawn Carey MRN: [id number] Date of Birth: [birth year] No data recorded  Encounter Date: 12/25/2018  End of Session - 12/25/18 1230    Visit Number  1    Number of Visits  1    Authorization Type  [employer] PPO    SLP Start Time  6384    SLP Stop Time   1115    SLP Time Calculation (min)  36 min    Activity Tolerance  Patient tolerated treatment well       Past Medical History:  Diagnosis Date  . CAD (coronary artery disease)    NSTEMI/7 vessel CABG, 03/2012  . Heart attack ([place])   . HLD (hyperlipidemia)   . HTN (hypertension)   . Pneumothorax   . 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 Tobacco abuse     Past Surgical History:  Procedure Laterality Date  . CORONARY ARTERY BYPASS GRAFT  03/27/2012   Procedure: CORONARY ARTERY BYPASS GRAFTING (CABG);  Surgeon: Grace Isaac, MD;  Location: [facility];  Service: Open Heart Surgery;  Laterality: N/A;  Coronary Artery Bypass Grafting X 7 Using Left Internal Mammary Artery and Right Saphenous Leg Vein Harvested Endoscopically  . LEFT HEART CATHETERIZATION WITH CORONARY ANGIOGRAM N/A 03/25/2012   Procedure: LEFT HEART CATHETERIZATION WITH CORONARY ANGIOGRAM;  Surgeon: Hillary Bow, MD;  [facility]: [facility] CATH LAB;  Service: Cardiovascular;  Laterality: N/A;  . THORACOTOMY     about 10 yrs ago  . TONSILLECTOMY      There were no vitals filed for this visit.  Subjective Assessment - 12/25/18 1211    Subjective  "It has been better since I started that medication." (PPI)    Currently in Pain?  No/denies          General - 12/25/18 1214      General Information   Date of Onset  11/25/18    HPI  [name] is a 63 yo male who was referred by Deberah Castle for MBSS due to unwitnessed aspiration event on Barium Swallow  completed 11/25/2018. Pt initially presented to his cardiologist with dysphagia symptoms of difficulty swallowing the first three bites of spicy, solid foods. He states that he would often have to regurgitate his foods. He denies symptoms with thin liquids. Pt was started on a PPI in April and reports a drastic improvement in his symptoms.    Type of Study  MBS-Modified Barium Swallow Study    Previous Swallow Assessment  BaSw 11/25/18: Unwitnessed episode of tracheal aspiration of contrast to approximately T1 level, with minimal spontaneous cough noted. Remainder of exam unremarkable.    Diet Prior to this Study  Regular;Thin liquids    Temperature Spikes Noted  No    Respiratory Status  Room air    History of Recent Intubation  No    Behavior/Cognition  Alert;Cooperative;Pleasant mood    Oral Cavity Assessment  Within Functional Limits    Oral Care Completed by SLP  No    Oral Cavity - Dentition  Dentures, top    Vision  Functional for self feeding    Self-Feeding Abilities  Able to feed self    Patient Positioning  Upright in chair    Baseline Vocal Quality  Normal    Volitional Cough  Strong    Volitional Swallow  Able  to elicit    Anatomy  Within functional limits   evidence of TMJ surgery   Pharyngeal Secretions  Not observed secondary MBS         Oral Preparation/Oral Phase - 12/25/18 1221      Oral Preparation/Oral Phase   Oral Phase  Impaired      Oral - Thin   Oral - Thin Teaspoon  Right anterior bolus loss   trace amount of barium R side/corner of mouth/lip due to decreased sensation from TMJD surgery     Oral - Solids   Oral - Regular  Piecemeal swallowing      Electrical stimulation - Oral Phase   Was Electrical Stimulation Used  No       Pharyngeal Phase - 12/25/18 1224      Pharyngeal Phase   Pharyngeal Phase  Impaired      Pharyngeal - Thin   Pharyngeal- Thin Teaspoon  Swallow initiation at vallecula;Pharyngeal residue - valleculae;Lateral channel residue    trace residuals   Pharyngeal- Thin Cup  Swallow initiation at vallecula;Pharyngeal residue - valleculae;Lateral channel residue    Pharyngeal- Thin Straw  Within functional limits      Pharyngeal - Solids   Pharyngeal- Puree  Swallow initiation at vallecula;Reduced tongue base retraction;Pharyngeal residue - valleculae   trace residuals   Pharyngeal- Regular  Within functional limits    Pharyngeal- Pill  Within functional limits      Electrical Stimulation - Pharyngeal Phase   Was Electrical Stimulation Used  No       Cricopharyngeal Phase - 12/25/18 1228      Cervical Esophageal Phase   Cervical Esophageal Phase  Within functional limits        Plan - 12/25/18 1232    Clinical Impression Statement  Oropharyngeal is essentially WNL in setting of upper dentures and previous TMJ surgery with resulting reduced sensation near chin/lips. Pt previously had all upper teeth extracted and needed TMJ surgery to align jaw for dentures. Swallow initiation is timely for age with trigger at the base of tongue and valleculae (except thins spill to pyriforms with barium tablet/thin). Pt with min reduced tongue base retraction to posterior pharyngeal wall approximation resulting in trace/min vallecular residuals which eventually clear. No penetration or aspiration observed during the study. Pt reports an improvement in symptoms since starting the PPI. He indicates that he has been taking it with his morning coffee. He was encouraged to take the PPI first thin in the morning and/or 30 minutes before his coffee. Pt drinks coffee throghout the day. Reflux precautions were reviewed with Pt. No further SLP services indicated at this time. Recommend regular textures and thin liquids with standard aspiration and reflux precautions. Pt in agreement with plan of care.    Potential to Achieve Goals  Good    Consulted and Agree with Plan of Care  Patient       Patient will benefit from skilled therapeutic  intervention in order to improve the following deficits and impairments:   1. Dysphagia, oropharyngeal phase       Recommendations/Treatment - 12/25/18 1228      Swallow Evaluation Recommendations   SLP Diet Recommendations  Age appropriate regular;Thin    Liquid Administration via  Cup;Straw    Medication Administration  Whole meds with liquid    Supervision  Patient able to self feed    Compensations  Multiple dry swallows after each bite/sip    Postural Changes  Seated upright at 90 degrees;Remain upright  for at least 30 minutes after feeds/meals       Prognosis - 12/25/18 1228      Prognosis   Prognosis for Safe Diet Advancement  Good    Barriers/Prognosis Comment  Pt reports improvement of symptoms since initiation of PPI, however he drinks coffee constantly and uses e-cigarettes      Individuals Consulted   Consulted and Agree with Results and Recommendations  Patient    Report Sent to   Referring physician       Problem List Patient Active Problem List   Diagnosis Date Noted  . TIA (transient ischemic attack) 07/08/2015  . Amaurosis fugax 07/08/2015  . 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 CAD (coronary artery disease)   . Tobacco abuse   . HLD (hyperlipidemia)   . HTN (hypertension)   . MI, acute, non ST segment elevation [facility]) 03/25/2012   Thank you,  Genene Churn, [place]  [facility] 12/25/2018, 12:41 PM  [place] [address] [place], [place], [address] Phone: [phone]   Fax:  [phone]  Name: Lary Siddall MRN: [id number] Date of Birth: 03-20-1956

## 2019-06-16 ENCOUNTER — Telehealth: Payer: Self-pay | Admitting: Cardiovascular Disease

## 2019-06-16 NOTE — Telephone Encounter (Signed)
Patient called requesting to know if he can take the [place] with his heart condition.

## 2019-06-16 NOTE — Telephone Encounter (Signed)
Left detailed message on VM.

## 2019-06-16 NOTE — Telephone Encounter (Signed)
That would be fine 

## 2019-06-27 HISTORY — PX: PANCREATIC CYST BIOPSY: SHX2155

## 2019-07-21 ENCOUNTER — Other Ambulatory Visit: Payer: Self-pay | Admitting: Cardiovascular Disease

## 2019-10-13 ENCOUNTER — Other Ambulatory Visit: Payer: Self-pay | Admitting: Cardiovascular Disease

## 2019-10-13 IMAGING — RF ESOPHAGUS/BARIUM SWALLOW/TABLET STUDY
11 of 14 series · 15 of 24 positions shown · non-contrast
Comparison: None

CLINICAL DATA: Esophageal dysphagia, improvement in symptoms
following starting a medication for reflux

EXAM:
ESOPHOGRAM/BARIUM SWALLOW
TECHNIQUE: Combined double contrast and single contrast examination performed
using effervescent crystals, thick barium liquid, and thin barium
liquid.
FLUOROSCOPY TIME:  Fluoroscopy Time:  1 minutes 54 seconds
Radiation Exposure Index (if provided by the fluoroscopic device):
35.8 mGy
Number of Acquired Spot Images: multiple fluoroscopic screen
captures

[Series 1: cp_standard · 0.17mm/px · 1 of 4 frames shown (1 of 11)]
[frame 1/4]
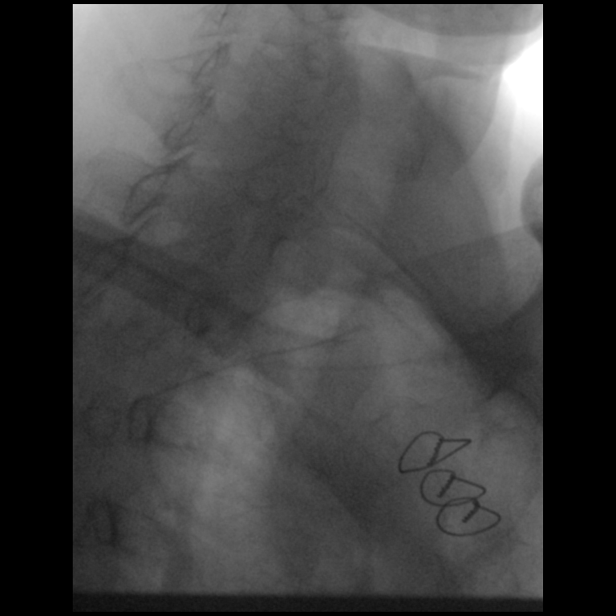

[Series 2: cp_standard · 0.17mm/px · 1 of 53 frames shown (2 of 11)]
[frame 36/53]
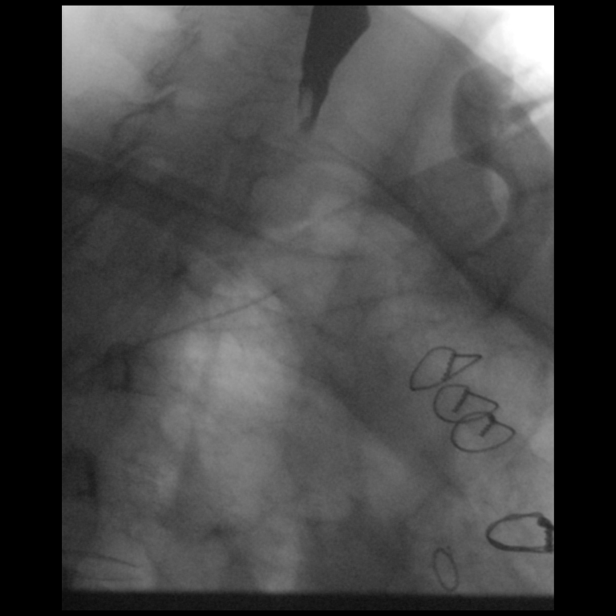

[Series 3: cp_standard · 0.17mm/px · 1 of 47 frames shown (3 of 11)]
[frame 38/47]
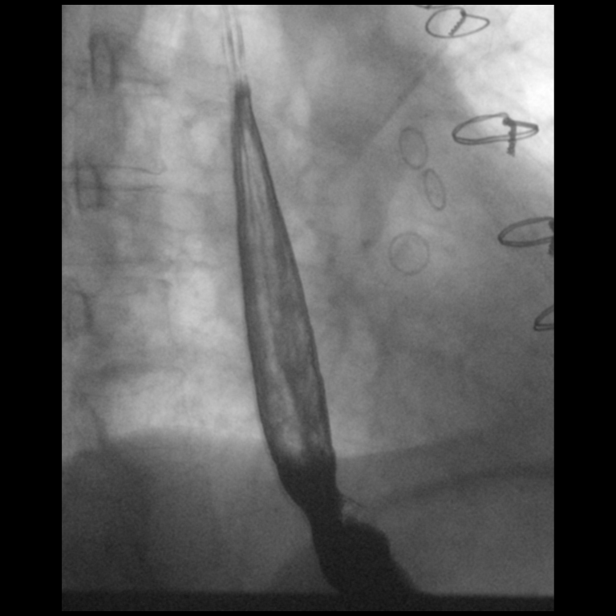

[Series 4: cp_standard · 0.18mm/px · 1 of 12 frames shown (4 of 11)]
[frame 6/12]
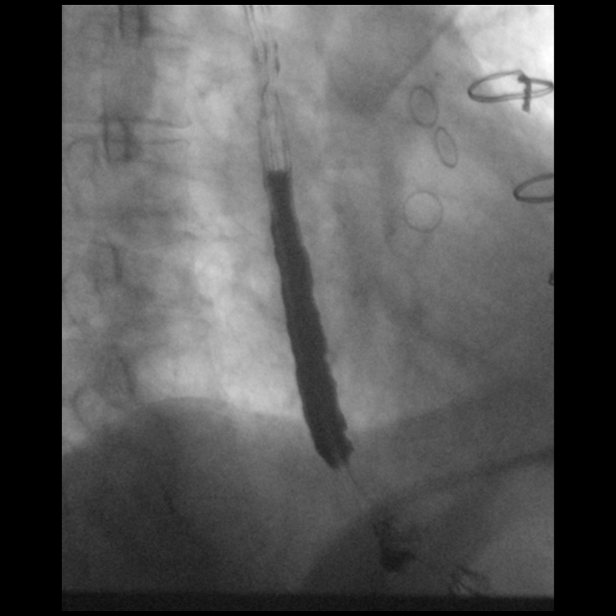

[Series 5: cp_standard · 0.18mm/px · 2 of 99 frames shown (5 of 11)]
[frame 25/99]
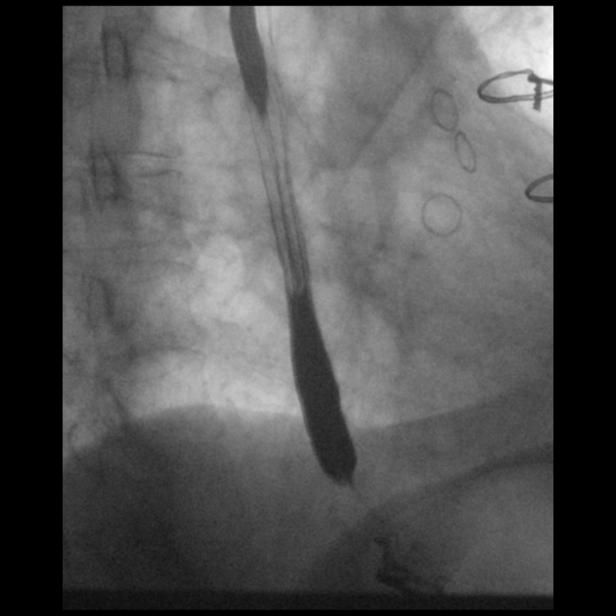
[frame 85/99]
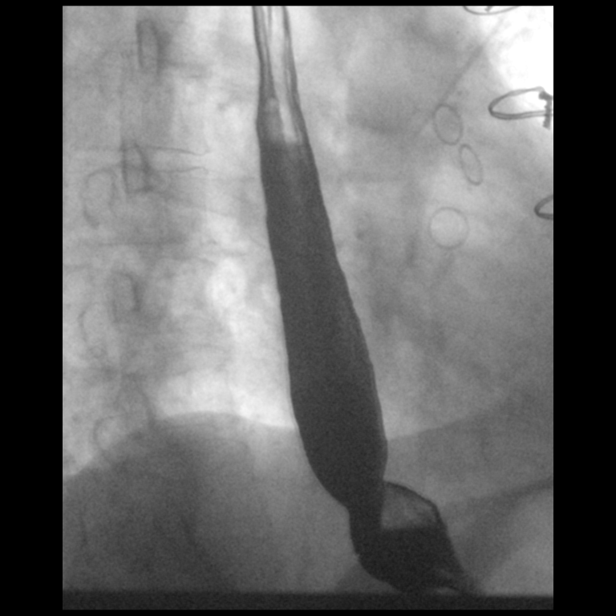

[Series 7: cp_standard · 0.26mm/px · 1 of 182 frames shown (6 of 11)]
[frame 28/182]
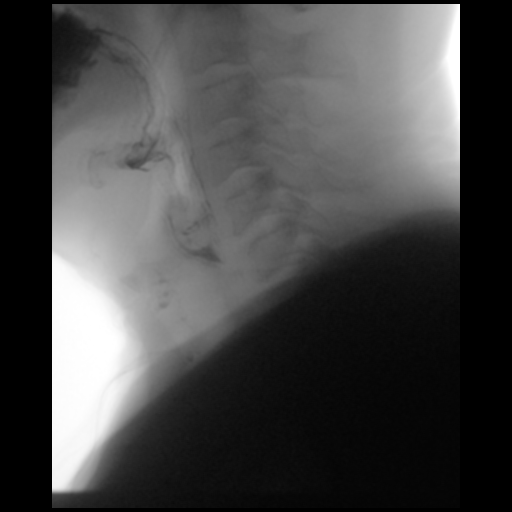

[Series 8: cp_standard · 0.28mm/px · 2 of 117 frames shown (7 of 11)]
[frame 59/117]
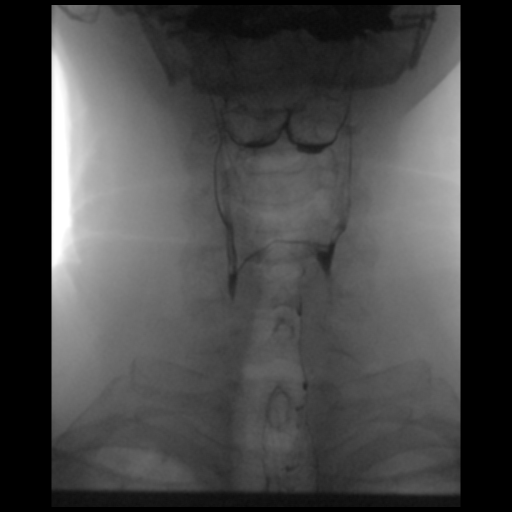
[frame 100/117]
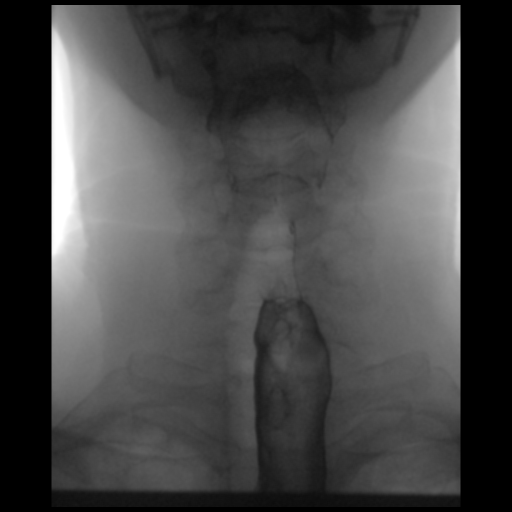

[Series 10: cp_standard · 0.19mm/px · 2 of 10 frames shown (8 of 11)]
[frame 2/10]
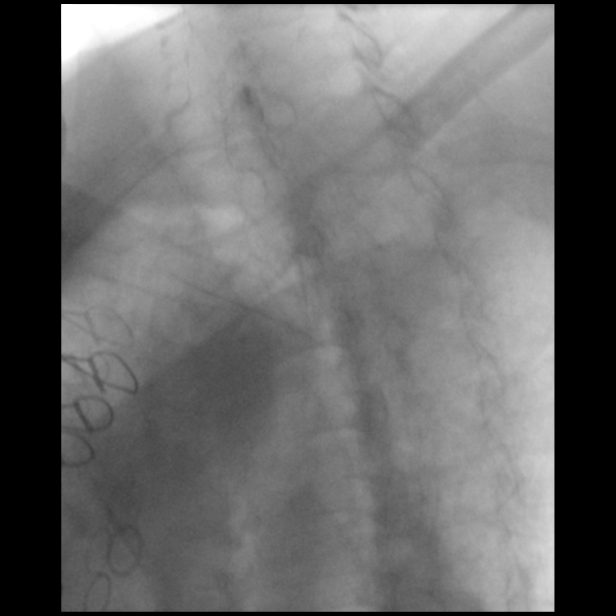
[frame 6/10]
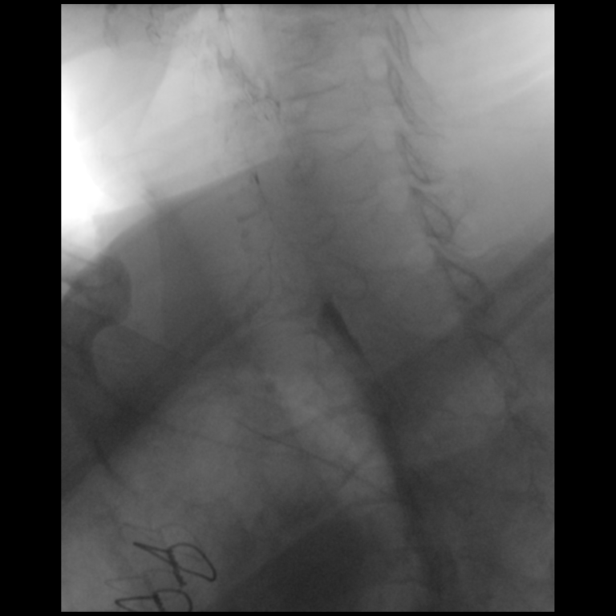

[Series 10: cp_standard · 0.19mm/px · 1 of 32 frames shown (9 of 11)]
[frame 24/32]
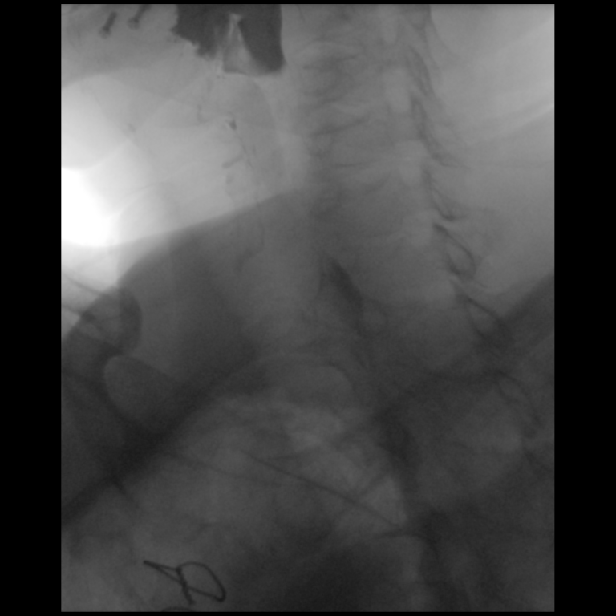

[Series 13: cp_standard · 0.19mm/px · 2 of 7 frames shown (10 of 11)]
[frame 2/7]
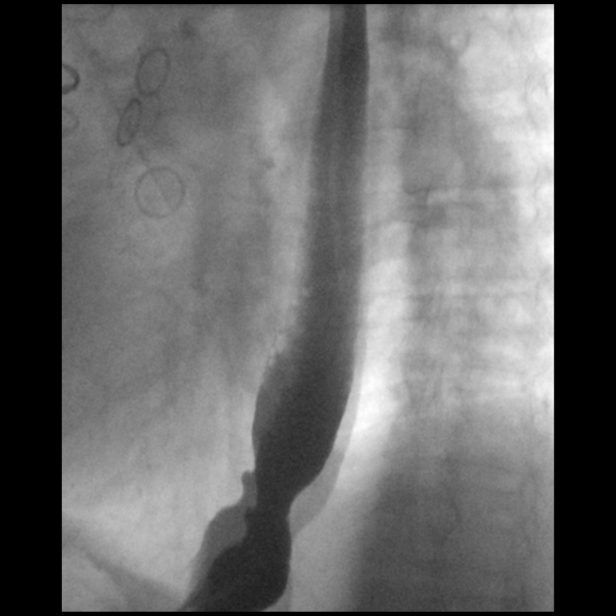
[frame 6/7]
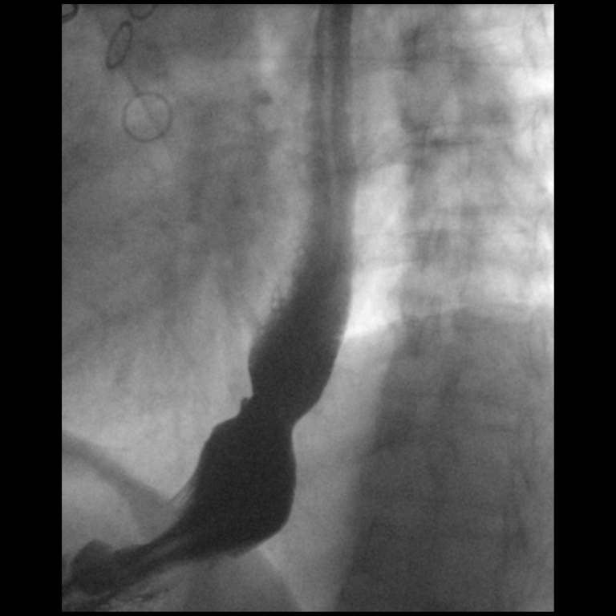

[Series 14: cp_standard · 0.19mm/px · 1 of 93 frames shown (11 of 11)]
[frame 80/93]
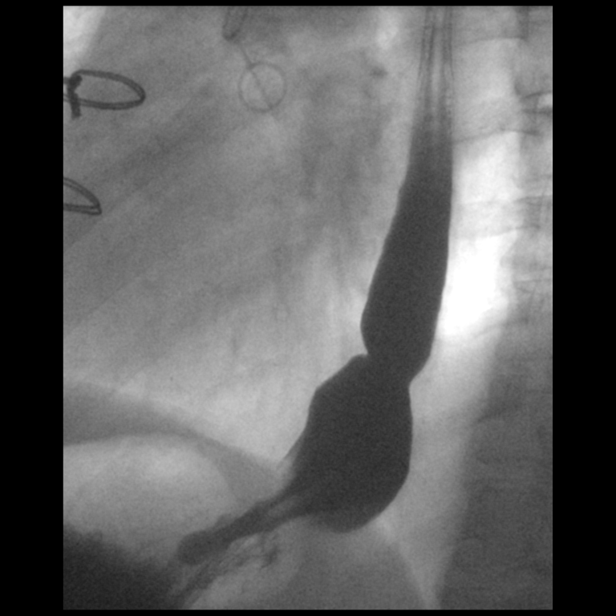

[15 of 24 positions shown; findings below may reference images not displayed]

FINDINGS: Esophageal distention: Normal without mass or stricture

Filling defects:  None

12.5 mm barium tablet: Easily passed from oral cavity to stomach
without obstruction

Motility:  Normal for age

Mucosa:  Smooth without irregularity or ulceration

Hypopharynx/cervical esophagus: Following the initial swallows of
thick barium, contrast was identified in the proximal trachea along
the anterior wall. Minimal spontaneous cough. Subsequent assessment
of swallowing in the AP and lateral projections showed no additional
laryngeal penetration or aspiration. A portion of the contrast was
cleared with voluntary coughing.

Hiatal hernia:  Absent

GE reflux:  Not witnessed during exam

Other:  N/A
IMPRESSION: Unwitnessed episode of tracheal aspiration of contrast to
approximately T1 level, with minimal spontaneous cough noted.

Remainder of exam unremarkable.

## 2019-11-23 ENCOUNTER — Other Ambulatory Visit: Payer: Self-pay | Admitting: Cardiovascular Disease

## 2020-01-02 ENCOUNTER — Other Ambulatory Visit: Payer: Self-pay | Admitting: Cardiovascular Disease

## 2020-01-12 ENCOUNTER — Other Ambulatory Visit: Payer: Self-pay

## 2020-01-12 NOTE — Telephone Encounter (Signed)
Pt requesting a refill on Pantoprazole. It was denied due to pt needs appt and has been contacted several times without any response.

## 2020-02-10 ENCOUNTER — Ambulatory Visit: Payer: No Typology Code available for payment source | Admitting: Family Medicine

## 2020-02-10 ENCOUNTER — Encounter: Payer: Self-pay | Admitting: Family Medicine

## 2020-02-10 VITALS — BP 124/80 | HR 66 | Ht 73.0 in | Wt 219.0 lb

## 2020-02-10 DIAGNOSIS — G459 Transient cerebral ischemic attack, unspecified: Secondary | ICD-10-CM | POA: Diagnosis not present

## 2020-02-10 DIAGNOSIS — I2510 Atherosclerotic heart disease of native coronary artery without angina pectoris: Secondary | ICD-10-CM | POA: Diagnosis not present

## 2020-02-10 DIAGNOSIS — E782 Mixed hyperlipidemia: Secondary | ICD-10-CM | POA: Diagnosis not present

## 2020-02-10 DIAGNOSIS — I10 Essential (primary) hypertension: Secondary | ICD-10-CM | POA: Diagnosis not present

## 2020-02-10 MED ORDER — NITROGLYCERIN 0.4 MG SL SUBL: 0.4000 mg | SUBLINGUAL_TABLET | SUBLINGUAL | 3 refills | Status: DC | PRN

## 2020-02-10 MED ORDER — EZETIMIBE 10 MG PO TABS: 10.0000 mg | ORAL_TABLET | Freq: Every day | ORAL | 2 refills | Status: DC

## 2020-02-10 MED ORDER — ATORVASTATIN CALCIUM 80 MG PO TABS: 80.0000 mg | ORAL_TABLET | Freq: Every day | ORAL | 2 refills | Status: DC

## 2020-02-10 MED ORDER — METOPROLOL SUCCINATE ER 25 MG PO TB24: 25.0000 mg | ORAL_TABLET | Freq: Every day | ORAL | 2 refills | Status: DC

## 2020-02-10 MED ORDER — PANTOPRAZOLE SODIUM 40 MG PO TBEC: 40.0000 mg | DELAYED_RELEASE_TABLET | Freq: Every day | ORAL | 2 refills | Status: DC

## 2020-02-10 NOTE — Patient Instructions (Signed)

## 2020-02-10 NOTE — Progress Notes (Signed)
Cardiology Office Note  Date: 02/10/2020   ID: Shawn Carey, DOB Oct 19, 1955, MRN [id number]  PCP:  Shawn Blitz, MD  Cardiologist:  No primary care provider on file. Electrophysiologist:  None   Chief Complaint: Coronary artery disease of bypass graft  History of Present Illness: Shawn Carey is a 64 y.o. 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  History of necrotizing pancreatitis.Gallbladder calculus needing cholecystectomy. Surgery was deferred d/t no insurance previously  Echocardiogram in 2017 showed normal LV SF with EF of 55 to 60%, normal WMA's, mild DD.  Last seen by Dr. Bronson Carey via telemedicine on 09/30/2018.  He stated when eating the 2-3 bites because belching or vomiting.  Food gets stuck in his throat, only has GERD and when eating spicy food..  Stated he did get short of breath when walking.  This was chronic since CABG.  He denied any exertional chest pain or lightheadedness, dizziness, palpitations syncope.  He had no nitroglycerin use since previous visit.  His blood pressure was controlled on current therapy.  He was continuing Lipitor and Zetia and lipid panel was ordered.  He was continuing aspirin for TIA.  A GI referral was made for dysphagia.  He was continuing Protonix for GERD.  A CBC and BMP were also ordered.  Patient is here for 1 year follow-up.  He denies any recent acute illnesses or hospitalizations.  No chest pain, chest pressure, chest tightness. No PND, no orthopnea, no bleeding, no CVA or TIA-like symptoms.  He continues with chronic exertional dyspnea since he had his bypass surgery in 2013.  He states this is nothing new.  Denies any orthostatic symptoms, claudication, or lower extremity edema.  Past Medical History:  Diagnosis Date  . CAD (coronary artery disease)    NSTEMI/7 vessel CABG, 03/2012  . Heart attack ([place])   . HLD (hyperlipidemia)     . HTN (hypertension)   . Pneumothorax   . 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 Tobacco abuse     Past Surgical History:  Procedure Laterality Date  . CORONARY ARTERY BYPASS GRAFT  03/27/2012   Procedure: CORONARY ARTERY BYPASS GRAFTING (CABG);  Surgeon: Shawn Isaac, MD;  Location: [facility];  Service: Open Heart Surgery;  Laterality: N/A;  Coronary Artery Bypass Grafting X 7 Using Left Internal Mammary Artery and Right Saphenous Leg Vein Harvested Endoscopically  . LEFT HEART CATHETERIZATION WITH CORONARY ANGIOGRAM N/A 03/25/2012   Procedure: LEFT HEART CATHETERIZATION WITH CORONARY ANGIOGRAM;  Surgeon: Shawn Bow, MD;  Location: [facility] CATH LAB;  Service: Cardiovascular;  Laterality: N/A;  . THORACOTOMY     about 10 yrs ago  . TONSILLECTOMY      Current Outpatient Medications  Medication Sig Dispense Refill  . aspirin EC 81 MG tablet Take 81 mg by mouth Hosman.    Marland Kitchen atorvastatin (LIPITOR) 80 MG tablet Take 1 tablet (80 mg total) by mouth Fredericks. 90 tablet 2  . ezetimibe (ZETIA) 10 MG tablet Take 1 tablet (10 mg total) by mouth Self. 90 tablet 2  . metoprolol succinate (TOPROL-XL) 25 MG 24 hr tablet Take 1 tablet (25 mg total) by mouth Strohm. 90 tablet 2  . nitroGLYCERIN (NITROSTAT) 0.4 MG SL tablet Place 1 tablet (0.4 mg total) under the tongue every 5 (five) minutes as needed for chest pain. 25 tablet 3  . pantoprazole (PROTONIX) 40 MG tablet Take  1 tablet (40 mg total) by mouth Krabill. 90 tablet 2   No current facility-administered medications for this visit.   Allergies:  Other   Social History: The patient  reports that he quit smoking about 7 years ago. His smoking use included e-cigarettes. He started smoking about 44 years ago. He has a 45.00 pack-year smoking history. He has never used smokeless tobacco. He reports that he does not drink alcohol and does not use drugs.   Family History: The patient's family history includes Colon cancer in his paternal  grandfather; Diabetes in an other family member; Heart disease in an other family member.   ROS:  Please see the history of present illness. Otherwise, complete review of systems is positive for none.  All other systems are reviewed and negative.   Physical Exam: VS:  BP 124/80   Pulse 66   Ht 6\' 1"  (1.854 m)   Wt 219 lb (99.3 kg)   SpO2 98%   BMI 28.89 kg/m , BMI Body mass index is 28.89 kg/m.  Wt Readings from Last 3 Encounters:  02/10/20 219 lb (99.3 kg)  10/03/18 209 lb 11.2 oz (95.1 kg)  09/30/18 210 lb (95.3 kg)    General: Patient appears comfortable at rest. Neck: Supple, no elevated JVP or carotid bruits, no thyromegaly. Lungs: Clear to auscultation, nonlabored breathing at rest. Cardiac: Regular rate and rhythm, no S3 or significant systolic murmur, no pericardial rub. Extremities: No pitting edema, distal pulses 2+. Skin: Warm and dry. Musculoskeletal: No kyphosis. Neuropsychiatric: Alert and oriented x3, affect grossly appropriate.  ECG:  An ECG dated 02/10/2020 was personally reviewed today and demonstrated:  Normal sinus rhythm rate of 65.  Recent Labwork: No results found for requested labs within last 8760 hours.     Component Value Date/Time   CHOL 158 11/03/2016 1540   TRIG 256 (H) 11/03/2016 1540   HDL 29 (L) 11/03/2016 1540   CHOLHDL 6.0 07/09/2015 0630   VLDL 57 (H) 07/09/2015 0630   LDLCALC 78 11/03/2016 1540    Other Studies Reviewed Today:  Echocardiogram 07/09/2015 Study Conclusions   - Left ventricle: The cavity size was normal. Systolic function was  normal. The estimated ejection fraction was in the range of 55%  to 60%. Wall motion was normal; there were no regional wall  motion abnormalities. Mild concentric and moderate focal basal  septal hypertrophy. Diastolic dysfunction, grade indeterminate  (likely mild).  - Mitral valve: There was mild regurgitation.  - Right ventricle: Systolic function was mildly reduced.    Assessment and Plan:  1. Coronary artery disease involving native coronary artery of native heart without angina pectoris   2. Essential hypertension   3. Mixed hyperlipidemia   4. TIA (transient ischemic attack)    1. Coronary artery disease involving native coronary artery of native heart without angina pectoris History of CABG x7 in 2013.  Denies any current anginal symptoms.  Does have chronic exertional dyspnea which he describes as being present since he had the surgery.  He states this is nothing new.  Continue aspirin 81 mg.  Nitroglycerin sublingual 0.4 mg as needed chest pain.  Continue Toprol-XL 25 mg p.o. Briones.  2. Essential hypertension He is normotensive today with a blood pressure 124/80.  Continue Toprol-XL 25 mg p.o. Sudbeck.  3. Mixed hyperlipidemia No recent lipid lab work.  He had lipid labs ordered at last visit which were never drawn.  Continue atorvastatin 80 mg Rion.  4. TIA (transient ischemic attack) He  has no focal neurologic deficits nor any new CVA or TIA-like symptoms.  Medication Adjustments/Labs and Tests Ordered: Current medicines are reviewed at length with the patient today.  Concerns regarding medicines are outlined above.   Disposition: Follow-up with Dr. Domenic Polite or APP 1 year  Signed, Levell July, NP 02/10/2020 3:53 PM    [place] [facility] [place], [place], [place] [address] Phone: [phone]; Fax: [phone]

## 2020-06-01 ENCOUNTER — Other Ambulatory Visit: Payer: Self-pay | Admitting: Family Medicine

## 2020-06-01 MED ORDER — METOPROLOL SUCCINATE ER 25 MG PO TB24: 25.0000 mg | ORAL_TABLET | Freq: Every day | ORAL | 1 refills | Status: DC

## 2020-06-01 MED ORDER — ATORVASTATIN CALCIUM 80 MG PO TABS: 80.0000 mg | ORAL_TABLET | Freq: Every day | ORAL | 1 refills | Status: DC

## 2020-06-01 MED ORDER — PANTOPRAZOLE SODIUM 40 MG PO TBEC: 40.0000 mg | DELAYED_RELEASE_TABLET | Freq: Every day | ORAL | 1 refills | Status: DC

## 2020-06-01 MED ORDER — EZETIMIBE 10 MG PO TABS: 10.0000 mg | ORAL_TABLET | Freq: Every day | ORAL | 1 refills | Status: DC

## 2020-06-01 NOTE — Telephone Encounter (Signed)
Medication sent to pharmacy  

## 2020-06-01 NOTE — Telephone Encounter (Signed)
° °  1. Which medications need to be refilled? (please list name of each medication and dose if known)  LIPITOR 80MG  ZETRA 10 MG TOPROL XL 25 MG PROTONIX 40   2. Which pharmacy/location (including street and city if local pharmacy) is medication to be sent to? [facility], [place]   3. Do they need a 30 day or 90 day supply?   Patient states that he was doing a mail order. He now has Multimedia programmer so he needs all prescriptions submitted to [place], [place], [place]

## 2020-06-22 ENCOUNTER — Encounter (HOSPITAL_COMMUNITY): Payer: Self-pay | Admitting: Emergency Medicine

## 2020-06-22 ENCOUNTER — Other Ambulatory Visit: Payer: Self-pay

## 2020-06-22 ENCOUNTER — Emergency Department (HOSPITAL_COMMUNITY)
Admission: EM | Admit: 2020-06-22 | Discharge: 2020-06-22 | Disposition: A | Discharge status: Home / Self Care | Payer: Worker's Compensation | Attending: Emergency Medicine | Admitting: Emergency Medicine

## 2020-06-22 DIAGNOSIS — I119 Hypertensive heart disease without heart failure: Secondary | ICD-10-CM | POA: Insufficient documentation

## 2020-06-22 DIAGNOSIS — Z87891 Personal history of nicotine dependence: Secondary | ICD-10-CM | POA: Diagnosis not present

## 2020-06-22 DIAGNOSIS — I25810 Atherosclerosis of coronary artery bypass graft(s) without angina pectoris: Secondary | ICD-10-CM | POA: Insufficient documentation

## 2020-06-22 DIAGNOSIS — Z79899 Other long term (current) drug therapy: Secondary | ICD-10-CM | POA: Insufficient documentation

## 2020-06-22 DIAGNOSIS — M545 Low back pain, unspecified: Secondary | ICD-10-CM | POA: Diagnosis not present

## 2020-06-22 DIAGNOSIS — I2510 Atherosclerotic heart disease of native coronary artery without angina pectoris: Secondary | ICD-10-CM | POA: Insufficient documentation

## 2020-06-22 DIAGNOSIS — Z7982 Long term (current) use of aspirin: Secondary | ICD-10-CM | POA: Insufficient documentation

## 2020-06-22 HISTORY — DX: Neoplasm of unspecified behavior of digestive system: D49.0

## 2020-06-22 MED ORDER — PREDNISONE 10 MG PO TABS: ORAL_TABLET | ORAL | 0 refills | Status: AC

## 2020-06-22 MED ORDER — METHOCARBAMOL 500 MG PO TABS: 500.0000 mg | ORAL_TABLET | Freq: Two times a day (BID) | ORAL | 0 refills | Status: AC

## 2020-06-22 MED ORDER — LIDOCAINE 5 % EX PTCH: 1.0000 | MEDICATED_PATCH | CUTANEOUS | 0 refills | Status: DC

## 2020-06-22 NOTE — ED Triage Notes (Signed)
Pt c/o left flank pain after picking up a heavy object and twisting the wrong way.

## 2020-06-22 NOTE — ED Provider Notes (Signed)
[facility] EMERGENCY DEPARTMENT Provider Note   CSN: [id number] Arrival date & time: 06/22/20  1024     History Chief Complaint  Patient presents with  . Flank Pain    Tymier Furlan is a 64 y.o. male history of CAD/CABG, hypertension, hyperlipidemia, PTX.  Patient reports 5 days ago he was working in [place], he was helping lift a ice chest out of the back of a cart that was filled with water. He lifted it straight up and turned towards his right to put it down. When he twisted he felt immediate pain to his left side. He describes a sharp pain which is only present when he moves or touches the area, pain does not radiate, moderate in intensity no alleviating factors. He has not attempted any medication to help his pain.  Denies fall, head injury, chest pain/shortness of breath, abdominal pain, dysuria/hematuria, numbness/weakness, tingling, saddle paresthesias, bowel/bladder incontinence, urinary retention or any additional concerns.  HPI     Past Medical History:  Diagnosis Date  . CAD (coronary artery disease)    NSTEMI/7 vessel CABG, 03/2012  . Heart attack ([place])   . HLD (hyperlipidemia)   . HTN (hypertension)   . Pancreatic tumor   . Pneumothorax   . 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 Tobacco abuse     Patient Active Problem List   Diagnosis Date Noted  . TIA (transient ischemic attack) 07/08/2015  . Amaurosis fugax 07/08/2015  . 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 CAD (coronary artery disease)   . Tobacco abuse   . HLD (hyperlipidemia)   . HTN (hypertension)   . MI, acute, non ST segment elevation ([place]) 03/25/2012    Past Surgical History:  Procedure Laterality Date  . CORONARY ARTERY BYPASS GRAFT  03/27/2012   Procedure: CORONARY ARTERY BYPASS GRAFTING (CABG);  Surgeon: Grace Isaac, MD;  Location: [place];  Service: Open Heart Surgery;  Laterality: N/A;  Coronary Artery Bypass Grafting X 7 Using Left Internal  Mammary Artery and Right Saphenous Leg Vein Harvested Endoscopically  . LEFT HEART CATHETERIZATION WITH CORONARY ANGIOGRAM N/A 03/25/2012   Procedure: LEFT HEART CATHETERIZATION WITH CORONARY ANGIOGRAM;  Surgeon: Hillary Bow, MD;  Location: [facility] CATH LAB;  Service: Cardiovascular;  Laterality: N/A;  . THORACOTOMY     about 10 yrs ago  . TONSILLECTOMY         Family History  Problem Relation Age of Onset  . Heart disease Other   . Diabetes Other   . Colon cancer Paternal Grandfather     Social History   Tobacco Use  . Smoking status: Former Smoker    Packs/day: 1.50    Years: 30.00    Pack years: 45.00    Types: E-cigarettes    Start date: 06/27/1975    Quit date: 03/24/2012    Years since quitting: 8.2  . Smokeless tobacco: Never Used  . Tobacco comment: currently uses an electric cigarette-has zero nicotine  Vaping Use  . Vaping Use: Never used  Substance Use Topics  . Alcohol use: No    Alcohol/week: 0.0 standard drinks  . Drug use: No    Home Medications Prior to Admission medications   Medication Sig Start Date End Date Taking? Authorizing Provider  lidocaine (LIDODERM) 5 % Place 1 patch onto the skin Willmann. Remove & Discard patch within 12 hours or as directed by MD 06/22/20  Yes Nuala Alpha A, PA-C  methocarbamol (ROBAXIN) 500 MG tablet Take  1 tablet (500 mg total) by mouth 2 (two) times Papesh for 7 days. 06/22/20 06/29/20 Yes Harlene Salts A, PA-C  predniSONE (DELTASONE) 10 MG tablet Take 5 tablets (50 mg total) by mouth Mraz for 1 day, THEN 4 tablets (40 mg total) Kumpf for 1 day, THEN 3 tablets (30 mg total) Mongillo for 1 day, THEN 2 tablets (20 mg total) Biederman for 1 day, THEN 1 tablet (10 mg total) Birr for 1 day. 06/22/20 06/27/20 Yes Bill Salinas, PA-C  aspirin EC 81 MG tablet Take 81 mg by mouth Sigmund.    [provider]  atorvastatin (LIPITOR) 80 MG tablet Take 1 tablet (80 mg total) by mouth Yoak. 06/01/20   Netta Neat., NP   ezetimibe (ZETIA) 10 MG tablet Take 1 tablet (10 mg total) by mouth Kamm. 06/01/20   Netta Neat., NP  metoprolol succinate (TOPROL-XL) 25 MG 24 hr tablet Take 1 tablet (25 mg total) by mouth Clipper. 06/01/20   Netta Neat., NP  nitroGLYCERIN (NITROSTAT) 0.4 MG SL tablet Place 1 tablet (0.4 mg total) under the tongue every 5 (five) minutes as needed for chest pain. 02/10/20 05/10/20  Netta Neat., NP  pantoprazole (PROTONIX) 40 MG tablet Take 1 tablet (40 mg total) by mouth Mcewan. 06/01/20   Netta Neat., NP    Allergies    Other  Review of Systems   Review of Systems  Constitutional: Negative.  Negative for chills and fever.  Respiratory: Negative.  Negative for shortness of breath.   Cardiovascular: Negative.  Negative for chest pain.  Gastrointestinal: Negative.  Negative for abdominal pain, nausea and vomiting.  Genitourinary: Negative.  Negative for dysuria and hematuria.  Musculoskeletal: Positive for myalgias. Negative for neck pain.  Neurological: Negative.  Negative for weakness and numbness.       Denies saddle area paresthesias. Denies bowel/bladder incontinence. Denies urinary retention.    Physical Exam Updated Vital Signs BP (!) 157/92 (BP Location: Right Arm)   Pulse 63   Temp 98.5 F (36.9 C) (Oral)   Resp 16   Ht 6\' 1"  (1.854 m)   Wt 95.3 kg   SpO2 97%   BMI 27.71 kg/m   Physical Exam Constitutional:      General: He is not in acute distress.    Appearance: Normal appearance. He is well-developed. He is not ill-appearing or diaphoretic.  HENT:     Head: Normocephalic and atraumatic.  Eyes:     General: Vision grossly intact. Gaze aligned appropriately.     Pupils: Pupils are equal, round, and reactive to light.  Neck:     Trachea: Trachea and phonation normal.  Cardiovascular:     Rate and Rhythm: Normal rate and regular rhythm.     Pulses:          Dorsalis pedis pulses are 2+ on the right side and 2+ on the left side.   Pulmonary:     Effort: Pulmonary effort is normal. No respiratory distress.     Breath sounds: Normal breath sounds. No decreased breath sounds.  Abdominal:     General: There is no distension.     Palpations: Abdomen is soft.     Tenderness: There is no abdominal tenderness. There is no guarding or rebound.  Musculoskeletal:        General: Normal range of motion.     Cervical back: Normal range of motion.       Back:  Comments: No midline C/T/L spinal tenderness to palpation, no deformity, crepitus, or step-off noted. No sign of injury to the neck or back. - Tenderness to palpation over the left lumbar paraspinal muscles and left abdominal external oblique without overlying skin change.  Feet:     Right foot:     Protective Sensation: 3 sites tested. 3 sites sensed.     Left foot:     Protective Sensation: 3 sites tested. 3 sites sensed.  Skin:    General: Skin is warm and dry.  Neurological:     Mental Status: He is alert.     GCS: GCS eye subscore is 4. GCS verbal subscore is 5. GCS motor subscore is 6.     Comments: Speech is clear and goal oriented, follows commands Major Cranial nerves without deficit, no facial droop Normal strength in upper and lower extremities bilaterally including dorsiflexion and plantar flexion, strong and equal grip strength Sensation normal to light and sharp touch Moves extremities without ataxia, coordination intact Normal gait  Psychiatric:        Behavior: Behavior normal.    ED Results / Procedures / Treatments   Labs (all labs ordered are listed, but only abnormal results are displayed) Labs Reviewed - No data to display  EKG None  Radiology No results found.  Procedures Procedures (including critical care time)  Medications Ordered in ED Medications - No data to display  ED Course  I have reviewed the triage vital signs and the nursing notes.  Pertinent labs & imaging results that were available during my care of the  patient were reviewed by me and considered in my medical decision making (see chart for details).    MDM Rules/Calculators/A&P                         Additional history obtained from: 1. Nursing notes from this visit. -------------------- 64 year old male presented to the ER today for left side pain after lifting and twisting while carrying a ice chest 5 days ago. Pain is reproducible on exam, appears to have external oblique and left paraspinal lumbar muscular strain. No overlying skin changes to suggest shingles. No bony tenderness on exam to suggest fracture/dislocation. Abdomen is soft nontender, doubt diverticulitis, perforation, SBO or other intra-abdominal pathologies. He has no midline back pain and no neurologic complaint to suggest cauda equina or other emergent neurosurgical pathologies. Patient denies urinary symptoms to suggest kidney stone disease or pyelonephritis. Cardiopulmonary exam unremarkable, normal breath sounds, no evidence for PTX or other acute intra thoracic pathologies. Plan of care is treatment with muscle relaxers, Lidoderm and short course of prednisone. Patient denies adverse reaction to prednisone in the past and denies history of diabetes. We discussed muscle relaxer precautions and patient stated understanding. He was referred to orthopedic for follow-up and encouraged to call his PCP. No indication for imaging or blood work at this time  At this time there does not appear to be any evidence of an acute emergency medical condition and the patient appears stable for discharge with appropriate outpatient follow up. Diagnosis was discussed with patient who verbalizes understanding of care plan and is agreeable to discharge. I have discussed return precautions with patient who verbalizes understanding. Patient encouraged to follow-up with their PCP and ortho. All questions answered.  Patient's case discussed with Dr. Karle Starch who agrees with plan to discharge with  steroids/muscle relaxers and outpatient follow-up.   Note: Portions of this report may have been  transcribed using voice recognition software. Every effort was made to ensure accuracy; however, inadvertent computerized transcription errors may still be present. Final Clinical Impression(s) / ED Diagnoses Final diagnoses:  Acute left-sided low back pain without sciatica    Rx / DC Orders ED Discharge Orders         Ordered    predniSONE (DELTASONE) 10 MG tablet        06/22/20 1407    methocarbamol (ROBAXIN) 500 MG tablet  2 times Kraker        06/22/20 1407    lidocaine (LIDODERM) 5 %  Every 24 hours        06/22/20 1407           Gari Crown 06/22/20 1408    Truddie Hidden, MD 06/23/20 [phone]

## 2020-06-22 NOTE — Discharge Instructions (Addendum)
At this time there does not appear to be the presence of an emergent medical condition, however there is always the potential for conditions to change. Please read and follow the below instructions.  Please return to the Emergency Department immediately for any new or worsening symptoms. Please be sure to follow up with your Primary Care Provider within one week regarding your visit today; please call their office to schedule an appointment even if you are feeling better for a follow-up visit. You may use the muscle relaxer Robaxin as prescribed to help with your symptoms.  Do not drive or operate heavy machinery while taking Robaxin as it will make you drowsy.  Do not drink alcohol or take other sedating medications while taking Robaxin as this will worsen side effects. You may use the Lidoderm patch as prescribed to help with your symptoms.  Lidoderm may be expensive so you may speak with your pharmacist about finding over-the-counter medications that work similarly. You may use the steroid medication prednisone as prescribed to help with your symptoms. You may call the orthopedic specialist Dr. Dallas Schimke for a follow-up appointment. Please avoid lifting and other movements that may exacerbate your pain.  Go to the nearest Emergency Department immediately if: You have fever or chills You develop new bowel or bladder control problems. You have unusual weakness or numbness in your arms or legs. You develop nausea or vomiting. You develop abdominal pain. You feel faint. You have chest pain or difficulty breathing You have pain when you pee or blood in your urine You have any new/concerning or worsening of symptoms  Please read the additional information packets attached to your discharge summary.  Do not take your medicine if  develop an itchy rash, swelling in your mouth or lips, or difficulty breathing; call 911 and seek immediate emergency medical attention if this occurs.  You may review  your lab tests and imaging results in their entirety on your MyChart account.  Please discuss all results of fully with your primary care provider and other specialist at your follow-up visit.  Note: Portions of this text may have been transcribed using voice recognition software. Every effort was made to ensure accuracy; however, inadvertent computerized transcription errors may still be present.

## 2021-01-16 ENCOUNTER — Other Ambulatory Visit: Payer: Self-pay | Admitting: Family Medicine

## 2021-02-22 ENCOUNTER — Other Ambulatory Visit: Payer: Self-pay | Admitting: Family Medicine

## 2021-03-07 NOTE — Progress Notes (Signed)
Cardiology Office Note  Date: 03/08/2021   ID: Shawn Carey, DOB April 21, 1956, MRN [id number]  PCP:  [name], MD  Cardiologist:  None Electrophysiologist:  None   Chief Complaint: 1 year follow-up  History of Present Illness: Shawn Carey is a 65 y.o. male with a history of CAD status post CABG, MI, HLD, HTN, tobacco abuse.   He was last seen by me February 10, 2020.  He denied any recent acute illnesses or hospitalizations.  No complaints of chest pain, pressure, tightness, PND, orthopnea, bleeding.  No CVA-like symptoms.  He continued with chronic dyspnea since bypass surgery in 2013.  No orthostatic symptoms, claudication or lower extremity edema.  He was continuing aspirin 81 mg Hakala, sublingual nitroglycerin as needed, Toprol-XL 25 mg p.o. Lough.  Blood pressure was well controlled on current therapy.  He was continuing atorvastatin 80 mg Gallon.  He is here today for 1 year follow-up.  He denies any recent acute illnesses or hospitalizations.  He denies any anginal or exertional symptoms.  He does complain of chronic dyspnea on exertion.  States it may be a little worse than normal recently.  He does admit he is put on some weight over the last year which may account for some of the shortness of breath.  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  He states that his shortness of breath has been chronic since his bypass surgery.  He denies any PND or orthopnea.  He denies any lower extremity edema.  Denies any CVA or TIA-like symptoms, orthostatic symptoms, palpitations or arrhythmias.  States it has been a while since she has had lab work but he has an appointment later today with his primary care provider.   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   Tobacco abuse     Past Surgical History:  Procedure Laterality Date   CORONARY ARTERY BYPASS GRAFT  03/27/2012   Procedure: CORONARY ARTERY BYPASS GRAFTING (CABG);  Surgeon: Grace Isaac, MD;  Location: [place];  Service: Open Heart Surgery;  Laterality: N/A;  Coronary Artery Bypass Grafting X 7 Using Left Internal Mammary Artery and Right Saphenous Leg Vein Harvested Endoscopically   LEFT HEART CATHETERIZATION WITH CORONARY ANGIOGRAM N/A 03/25/2012   Procedure: LEFT HEART CATHETERIZATION WITH CORONARY ANGIOGRAM;  Surgeon: Hillary Bow, MD;  Location: [facility] CATH LAB;  Service: Cardiovascular;  Laterality: N/A;   THORACOTOMY     about 10 yrs ago   TONSILLECTOMY     TUMOR REMOVAL  2020   from pancreas    Current Outpatient Medications  Medication Sig Dispense Refill   aspirin EC 81 MG tablet Take 81 mg by mouth Dueitt.     atorvastatin (LIPITOR) 80 MG tablet Take 1 tablet by mouth once Vanegas 14 tablet 0   ezetimibe (ZETIA) 10 MG tablet Take 1 tablet by mouth once Rager 14 tablet 0   metoprolol succinate (TOPROL-XL) 25 MG 24 hr tablet Take 1 tablet by mouth once Bracewell 14 tablet 0   pantoprazole (PROTONIX) 40 MG tablet Take 1 tablet by mouth once  Double 14 tablet 0   lidocaine (LIDODERM) 5 % Place 1 patch onto the skin Venn. Remove & Discard patch within 12 hours or as directed by MD (Patient not taking: Reported on 03/08/2021) 15 patch 0   nitroGLYCERIN (NITROSTAT) 0.4 MG SL tablet Place 1 tablet (0.4 mg total) under the tongue every 5 (five) minutes as needed for chest pain. 25 tablet 3   No current facility-administered medications for this visit.   Allergies:  Other   Social History: The patient  reports that he quit smoking about 8 years ago. His smoking use included e-cigarettes. He started smoking about 45 years ago. He has a 45.00 pack-year  smoking history. He has never used smokeless tobacco. He reports that he does not drink alcohol and does not use drugs.   Family History: The patient's family history includes Colon cancer in his paternal grandfather; Diabetes in an other family member; Heart disease in an other family member.   ROS:  Please see the history of present illness. Otherwise, complete review of systems is positive for none.  All other systems are reviewed and negative.   Physical Exam: VS:  BP 138/76   Pulse 66   Ht '6\' 1"'$  (1.854 m)   Wt 234 lb (106.1 kg)   SpO2 98%   BMI 30.87 kg/m , BMI Body mass index is 30.87 kg/m.  Wt Readings from Last 3 Encounters:  03/08/21 234 lb (106.1 kg)  06/22/20 210 lb (95.3 kg)  02/10/20 219 lb (99.3 kg)    General: Patient appears comfortable at rest. Neck: Supple, no elevated JVP or carotid bruits, no thyromegaly. Lungs: Clear to auscultation, nonlabored breathing at rest. Cardiac: Regular rate and rhythm, no S3 or significant systolic murmur, no pericardial rub. Extremities: No pitting edema, distal pulses 2+. Skin: Warm and dry. Musculoskeletal: No kyphosis. Neuropsychiatric: Alert and oriented x3, affect grossly appropriate.  ECG: 03/08/2021 normal sinus rhythm rate of 66.  Recent Labwork: No results found for requested labs within last 8760 hours.     Component Value Date/Time   CHOL 158 11/03/2016 1540   TRIG 256 (H) 11/03/2016 1540   HDL 29 (L) 11/03/2016 1540   CHOLHDL 6.0 07/09/2015 0630   VLDL 57 (H) 07/09/2015 0630   LDLCALC 78 11/03/2016 1540    Other Studies Reviewed Today:   Echocardiogram 07/09/2015 Study Conclusions   - Left ventricle: The cavity size was normal. Systolic function was    normal. The estimated ejection fraction was in the range of 55%    to 60%. Wall motion was normal; there were no regional wall    motion abnormalities. Mild concentric and moderate focal basal    septal hypertrophy. Diastolic dysfunction, grade  indeterminate    (likely mild).  - Mitral valve: There was mild regurgitation.  - Right ventricle: Systolic function was mildly reduced.     Assessment and Plan:  1. CAD in native artery   2. Essential hypertension   3. Mixed hyperlipidemia   4. TIA (transient ischemic attack)   5. SOB (shortness of breath)    1. CAD in native artery Denies any anginal symptoms, or nitroglycerin use.  Continue aspirin 81 mg Doiron, nitroglycerin sublingual as needed  2. Essential hypertension Blood pressure slightly elevated today at 138/76.  He states sometimes he forgets to take his blood pressure medication.  Continue Toprol-XL 25 mg Cooter.  3. Mixed hyperlipidemia Continue atorvastatin 80 mg Rattan, Zetia 10 mg Ziemba.  He states he has an appointment with  his PCP later today he will more than likely order follow-up lipids.  4. TIA (transient ischemic attack) No CVA or TIA-like symptoms.  He does state he has had 2 separate episodes over the last 5 years when getting up from sleeping in the morning where he feels as though there is some presents in the left side of his head which pulls him to the left side or he has the sensation of being pulled to the left side.  He states these 2 episodes only lasted seconds and one leg episode was more recent.  He has had no more recurrences since that time.  5.  Shortness of breath He has chronic shortness of breath but states recently shortness of breath seems possibly worse.  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  Please get follow-up echocardiogram to reassess LV function, diastolic function and valvular function.  Medication Adjustments/Labs and Tests Ordered: Current medicines are reviewed at length with the patient today.  Concerns regarding medicines are outlined above.   Disposition: Follow-up with Dr. Harl Bowie or APP 1  year  Signed, Levell July, NP 03/08/2021 8:37 AM    [facility] [address], [place], [place] [address] Phone: [phone]; Fax: [phone]

## 2021-03-08 ENCOUNTER — Ambulatory Visit (INDEPENDENT_AMBULATORY_CARE_PROVIDER_SITE_OTHER): Payer: No Typology Code available for payment source | Admitting: Family Medicine

## 2021-03-08 ENCOUNTER — Encounter: Payer: Self-pay | Admitting: Family Medicine

## 2021-03-08 ENCOUNTER — Telehealth: Payer: Self-pay | Admitting: Family Medicine

## 2021-03-08 VITALS — BP 138/76 | HR 66 | Ht 73.0 in | Wt 234.0 lb

## 2021-03-08 DIAGNOSIS — E782 Mixed hyperlipidemia: Secondary | ICD-10-CM | POA: Diagnosis not present

## 2021-03-08 DIAGNOSIS — G459 Transient cerebral ischemic attack, unspecified: Secondary | ICD-10-CM

## 2021-03-08 DIAGNOSIS — I10 Essential (primary) hypertension: Secondary | ICD-10-CM | POA: Diagnosis not present

## 2021-03-08 DIAGNOSIS — I2510 Atherosclerotic heart disease of native coronary artery without angina pectoris: Secondary | ICD-10-CM | POA: Diagnosis not present

## 2021-03-08 DIAGNOSIS — R0602 Shortness of breath: Secondary | ICD-10-CM

## 2021-03-08 MED ORDER — EZETIMIBE 10 MG PO TABS: 10.0000 mg | ORAL_TABLET | Freq: Every day | ORAL | 2 refills | Status: DC

## 2021-03-08 MED ORDER — NITROGLYCERIN 0.4 MG SL SUBL: 0.4000 mg | SUBLINGUAL_TABLET | SUBLINGUAL | 3 refills | Status: DC | PRN

## 2021-03-08 MED ORDER — METOPROLOL SUCCINATE ER 25 MG PO TB24: 25.0000 mg | ORAL_TABLET | Freq: Every day | ORAL | 2 refills | Status: DC

## 2021-03-08 MED ORDER — ATORVASTATIN CALCIUM 80 MG PO TABS: 80.0000 mg | ORAL_TABLET | Freq: Every day | ORAL | 2 refills | Status: DC

## 2021-03-08 NOTE — Patient Instructions (Addendum)
Medication Instructions:  Your physician recommends that you continue on your current medications as directed. Please refer to the Current Medication list given to you today.  Labwork: none  Testing/Procedures: Your physician has requested that you have an echocardiogram. Echocardiography is a painless test that uses sound waves to create images of your heart. It provides your doctor with information about the size and shape of your heart and how well your heart's chambers and valves are working. This procedure takes approximately one hour. There are no restrictions for this procedure.   Follow-Up: Your physician recommends that you schedule a follow-up appointment in: 1 year. You will receive a reminder letter or call in the mail in about 10 months reminding you to call and schedule your appointment. If you don't receive this letter or call, please contact our office.   Any Other Special Instructions Will Be Listed Below (If Applicable).  If you need a refill on your cardiac medications before your next appointment, please call your pharmacy.

## 2021-03-08 NOTE — Telephone Encounter (Signed)
Checking percert on the following patient for testing scheduled at [facility].     ECHO   03/25/2021

## 2021-03-13 ENCOUNTER — Other Ambulatory Visit: Payer: Self-pay | Admitting: Family Medicine

## 2021-03-25 ENCOUNTER — Other Ambulatory Visit: Payer: Self-pay

## 2021-03-25 ENCOUNTER — Ambulatory Visit (HOSPITAL_COMMUNITY)
Admission: RE | Admit: 2021-03-25 | Discharge: 2021-03-25 | Disposition: A | Discharge status: Home / Self Care | Payer: PRIVATE HEALTH INSURANCE | Source: Ambulatory Visit | Attending: Family Medicine | Admitting: Family Medicine

## 2021-03-25 DIAGNOSIS — R0602 Shortness of breath: Secondary | ICD-10-CM | POA: Diagnosis not present

## 2021-03-25 LAB — ECHOCARDIOGRAM COMPLETE
Area-P 1/2: 3.91 cm2
S' Lateral: 3.1 cm

## 2021-03-25 NOTE — Progress Notes (Signed)
*  PRELIMINARY RESULTS* Echocardiogram 2D Echocardiogram has been performed.  Samuel Germany 03/25/2021, 9:14 AM

## 2021-04-07 ENCOUNTER — Telehealth: Payer: Self-pay | Admitting: *Deleted

## 2021-04-07 ENCOUNTER — Encounter: Payer: Self-pay | Admitting: *Deleted

## 2021-04-07 DIAGNOSIS — I2510 Atherosclerotic heart disease of native coronary artery without angina pectoris: Secondary | ICD-10-CM

## 2021-04-07 DIAGNOSIS — R0602 Shortness of breath: Secondary | ICD-10-CM

## 2021-04-07 NOTE — Telephone Encounter (Signed)
-----   Message from Laurine Blazer, LPN sent at 1/65/7903  5:31 PM EDT -----  ----- Message ----- From: Verta Ellen., NP Sent: 03/25/2021   5:00 PM EDT To: Laurine Blazer, LPN  Please call the patient and let him know the echocardiogram shows he has good pumping function of the heart.  He does have some wall motion abnormalities.  This was not present on previous echo back in 2017.  Sometimes this can mean there is a lack of blood flow through the coronary arteries affecting that area of the heart.  Ask him if he is willing to undergo a Lexiscan stress test to make sure there is no lack of blood flow through the coronary arteries that would affect this.  If he agrees go ahead and schedule a Lexi scan Myoview stress test.  There is evidence of what referred to as (trabeculation or noncompaction of tissue) in the left ventricle in an area where there is decreased movement.  Dr. Domenic Polite is suggesting getting a follow-up echocardiogram with Definity contrast to better evaluate.  He has a mildly leaking mitral valve.  Please test his patient if he is willing to get a repeat echo with contrast to better look at the area Dr. Domenic Polite has mentioned.  If so go ahead and schedule repeat echo with Definity to better evaluate that area.  Thank you   Verta Ellen, NP  03/25/2021 4:46 PM

## 2021-04-07 NOTE — Telephone Encounter (Signed)
Patient informed. Copy sent to PCP Lexiscan instructions read to patient and made available in mychart

## 2021-05-09 ENCOUNTER — Telehealth: Payer: Self-pay | Admitting: Family Medicine

## 2021-05-09 NOTE — Telephone Encounter (Signed)
Checking percert on the following patient for testing scheduled at [facility].     LEXISCAN ECHO   05/23/2021

## 2021-05-23 ENCOUNTER — Encounter (HOSPITAL_COMMUNITY): Payer: No Typology Code available for payment source

## 2021-05-23 ENCOUNTER — Ambulatory Visit (HOSPITAL_COMMUNITY)
Admission: RE | Admit: 2021-05-23 | Discharge: 2021-05-23 | Disposition: A | Discharge status: Home / Self Care | Payer: PRIVATE HEALTH INSURANCE | Source: Ambulatory Visit | Attending: Family Medicine | Admitting: Family Medicine

## 2021-05-23 DIAGNOSIS — R0602 Shortness of breath: Secondary | ICD-10-CM | POA: Diagnosis present

## 2021-05-23 DIAGNOSIS — I2510 Atherosclerotic heart disease of native coronary artery without angina pectoris: Secondary | ICD-10-CM

## 2021-05-23 MED ORDER — PERFLUTREN LIPID MICROSPHERE
1.0000 mL | INTRAVENOUS | Status: AC | PRN
  Administered 2021-05-23: 12:00:00 3 mL via INTRAVENOUS
  Filled 2021-05-23: qty 10

## 2021-05-23 NOTE — Progress Notes (Signed)
*  PRELIMINARY RESULTS* Echocardiogram Limited 2-D Echocardiogram  has been performed with Definity.  Shawn Carey 05/23/2021, 11:55 AM

## 2021-05-26 ENCOUNTER — Telehealth: Payer: Self-pay | Admitting: *Deleted

## 2021-05-26 NOTE — Telephone Encounter (Signed)
Patient informed. Copy sent to PCP °

## 2021-05-26 NOTE — Telephone Encounter (Signed)
-----   Message from Satira Sark, MD sent at 05/23/2021  2:21 PM EST ----- Echocardiogram ordered by Mr. Shawn Sake NP, I came across the result on review of his in basket.  This is a former patient of Dr. Bronson Ing.  Study was ordered to exclude LV mural thrombus.  Definity contrast does not show LV mural thrombus and LVEF 55 to 60%.  Please make sure that patient is scheduled for follow-up with new MD.

## 2021-06-07 ENCOUNTER — Other Ambulatory Visit: Payer: Self-pay

## 2021-06-07 ENCOUNTER — Ambulatory Visit (HOSPITAL_COMMUNITY)
Admission: RE | Admit: 2021-06-07 | Discharge: 2021-06-07 | Disposition: A | Discharge status: Home / Self Care | Payer: No Typology Code available for payment source | Source: Ambulatory Visit | Attending: Family Medicine | Admitting: Family Medicine

## 2021-06-07 ENCOUNTER — Encounter (HOSPITAL_COMMUNITY): Payer: Self-pay

## 2021-06-07 ENCOUNTER — Encounter (HOSPITAL_COMMUNITY)
Admission: RE | Admit: 2021-06-07 | Discharge: 2021-06-07 | Disposition: A | Discharge status: Home / Self Care | Payer: No Typology Code available for payment source | Source: Ambulatory Visit | Attending: Family Medicine | Admitting: Family Medicine

## 2021-06-07 DIAGNOSIS — R0602 Shortness of breath: Secondary | ICD-10-CM | POA: Diagnosis not present

## 2021-06-07 DIAGNOSIS — I2510 Atherosclerotic heart disease of native coronary artery without angina pectoris: Secondary | ICD-10-CM | POA: Insufficient documentation

## 2021-06-07 LAB — NM MYOCAR MULTI W/SPECT W/WALL MOTION / EF
LV dias vol: 131 mL (ref 62–150)
LV sys vol: 56 mL
Nuc Stress EF: 57 %
Peak HR: 83 {beats}/min
RATE: 0.4
Rest HR: 54 {beats}/min
Rest Nuclear Isotope Dose: 11 mCi
SDS: 3
SRS: 4
SSS: 7
ST Depression (mm): 0 mm
Stress Nuclear Isotope Dose: 32 mCi
TID: 1.16

## 2021-06-07 MED ORDER — TECHNETIUM TC 99M TETROFOSMIN IV KIT
30.0000 | PACK | Freq: Once | INTRAVENOUS | Status: AC | PRN
  Administered 2021-06-07: 32 via INTRAVENOUS

## 2021-06-07 MED ORDER — SODIUM CHLORIDE FLUSH 0.9 % IV SOLN
INTRAVENOUS | Status: AC
  Administered 2021-06-07: 10 mL via INTRAVENOUS
  Filled 2021-06-07: qty 10

## 2021-06-07 MED ORDER — REGADENOSON 0.4 MG/5ML IV SOLN
INTRAVENOUS | Status: AC
  Administered 2021-06-07: 0.4 mg via INTRAVENOUS
  Filled 2021-06-07: qty 5

## 2021-06-07 MED ORDER — TECHNETIUM TC 99M TETROFOSMIN IV KIT
10.0000 | PACK | Freq: Once | INTRAVENOUS | Status: AC | PRN
  Administered 2021-06-07: 11 via INTRAVENOUS

## 2021-07-14 ENCOUNTER — Ambulatory Visit: Payer: No Typology Code available for payment source | Admitting: Cardiology

## 2021-07-14 ENCOUNTER — Other Ambulatory Visit: Payer: Self-pay

## 2021-07-14 ENCOUNTER — Encounter: Payer: Self-pay | Admitting: Cardiology

## 2021-07-14 DIAGNOSIS — Z951 Presence of aortocoronary bypass graft: Secondary | ICD-10-CM | POA: Diagnosis not present

## 2021-07-14 DIAGNOSIS — E782 Mixed hyperlipidemia: Secondary | ICD-10-CM | POA: Diagnosis not present

## 2021-07-14 DIAGNOSIS — I2510 Atherosclerotic heart disease of native coronary artery without angina pectoris: Secondary | ICD-10-CM | POA: Diagnosis not present

## 2021-07-14 DIAGNOSIS — I10 Essential (primary) hypertension: Secondary | ICD-10-CM

## 2021-07-14 NOTE — Assessment & Plan Note (Signed)
As described above.  2013.

## 2021-07-14 NOTE — Patient Instructions (Signed)
Medication Instructions:  Your physician recommends that you continue on your current medications as directed. Please refer to the Current Medication list given to you today.   Labwork: None   Testing/Procedures: None   Follow-Up: 1 year  Any Other Special Instructions Will Be Listed Below (If Applicable).  If you need a refill on your cardiac medications before your next appointment, please call your pharmacy.

## 2021-07-14 NOTE — Progress Notes (Signed)
Cardiology Office Note:    Date:  07/14/2021   ID:  Shawn Carey, DOB [birth year], MRN [id number]  PCP:  [name], MD   [facility] Providers Cardiologist:  None     Referring MD: [name], MD    History of Present Illness:    Shawn Carey is a 66 y.o. male here for the follow-up of coronary artery disease post CABG, myocardial infarction hyperlipidemia hypertension tobacco use.  Previously seen 03/08/2021 by Levell July, NP.  Was doing fairly well with no chest pain shortness of breath strokelike symptoms.  He had chronic dyspnea since his bypass surgery in 2013.  No claudication.  Continuing with his medication as well as statin therapy high intensity.  He put on some weight last year which could account for some of his shortness of breath.  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  Becomes short of breath with heavy activity.  Fairly chronic since the bypass.  Quit tob after 30 or more years however he does still vape with no nicotine he states.   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   Tobacco abuse     Past Surgical History:  Procedure Laterality Date   CORONARY ARTERY BYPASS GRAFT  03/27/2012   Procedure: CORONARY ARTERY BYPASS GRAFTING (CABG);  Surgeon: Grace Isaac, MD;  Location: [facility];  Service: Open Heart Surgery;  Laterality: N/A;  Coronary Artery Bypass Grafting X 7 Using Left Internal Mammary Artery and Right Saphenous Leg Vein Harvested Endoscopically   LEFT HEART CATHETERIZATION WITH CORONARY ANGIOGRAM N/A 03/25/2012   Procedure: LEFT HEART CATHETERIZATION WITH CORONARY ANGIOGRAM;  Surgeon: Hillary Bow, MD;  Location: [facility] CATH LAB;  Service: Cardiovascular;  Laterality: N/A;   THORACOTOMY     about 10  yrs ago   TONSILLECTOMY     TUMOR REMOVAL  2020   from pancreas    Current Medications: Current Meds  Medication Sig   aspirin EC 81 MG tablet Take 81 mg by mouth Nase.   atorvastatin (LIPITOR) 80 MG tablet Take 1 tablet (80 mg total) by mouth Noack.   ezetimibe (ZETIA) 10 MG tablet Take 1 tablet (10 mg total) by mouth Kump.   metoprolol succinate (TOPROL-XL) 25 MG 24 hr tablet Take 1 tablet (25 mg total) by mouth Holtzer.   nitroGLYCERIN (NITROSTAT) 0.4 MG SL tablet Place 1 tablet (0.4 mg total) under the tongue every 5 (five) minutes x 3 doses as needed for chest pain (if no relief after second dose proceed to the ED or call 911).   pantoprazole (PROTONIX) 40 MG tablet Take 1 tablet by mouth once Pandolfi     Allergies:   Other   Social History   Socioeconomic History   Marital status: Married    Spouse name: Not on file   Number of children: Not on file   Years of education: Not on file   Highest education level: Not on file  Occupational History   Not on file  Tobacco Use   Smoking status: Former    Packs/day: 1.50    Years: 30.00    Pack years: 45.00    Types: E-cigarettes, Cigarettes    Start date: 06/27/1975    Quit  date: 03/24/2012    Years since quitting: 9.3   Smokeless tobacco: Never   Tobacco comments:    currently uses an electric cigarette-has zero nicotine  Vaping Use   Vaping Use: Some days   Substances: Nicotine   Devices: 0% Nic  Substance and Sexual Activity   Alcohol use: No    Alcohol/week: 0.0 standard drinks   Drug use: No   Sexual activity: Yes    Birth control/protection: None  Other Topics Concern   Not on file  Social History Narrative   Not on file   Social Determinants of Health   Financial Resource Strain: Not on file  Food Insecurity: Not on file  Transportation Needs: Not on file  Physical Activity: Not on file  Stress: Not on file  Social Connections: Not on file     Family History: The patient's family history includes  Colon cancer in his paternal grandfather; Diabetes in an other family member; Heart disease in an other family member.  ROS:   Please see the history of present illness.     All other systems reviewed and are negative.  EKGs/Labs/Other Studies Reviewed:    The following studies were reviewed today: Echocardiogram 05/15/2021 shows EF 55 to 60% with hypokinetic apex.  No LV thrombus on contrast imaging, mild mitral regurgitation.  Nuclear stress test on 06/07/2021 was performed that showed low risk, anteroseptal infarct pattern mild inferoapical peri-infarct ischemia with EF of 57%.  EKG:   His prior EKG showed normal sinus rhythm heart rate of 66 personally reviewed.   Recent Labs: No results found for requested labs within last 8760 hours.  Recent Lipid Panel    Component Value Date/Time   CHOL 158 11/03/2016 1540   TRIG 256 (H) 11/03/2016 1540   HDL 29 (L) 11/03/2016 1540   CHOLHDL 6.0 07/09/2015 0630   VLDL 57 (H) 07/09/2015 0630   LDLCALC 78 11/03/2016 1540     Risk Assessment/Calculations:              Physical Exam:    VS:  BP 130/80    Pulse 60    Ht 6\' 1"  (1.854 m)    Wt 235 lb 12.8 oz (107 kg)    SpO2 97%    BMI 31.11 kg/m     Wt Readings from Last 3 Encounters:  07/14/21 235 lb 12.8 oz (107 kg)  03/08/21 234 lb (106.1 kg)  06/22/20 210 lb (95.3 kg)     GEN:  Well nourished, well developed in no acute distress HEENT: Normal NECK: No JVD; No carotid bruits LYMPHATICS: No lymphadenopathy CARDIAC: RRR, no murmurs, no rubs, gallops RESPIRATORY:  Clear to auscultation without rales, wheezing or rhonchi  ABDOMEN: Soft, non-tender, non-distended MUSCULOSKELETAL:  No edema; No deformity  SKIN: Warm and dry NEUROLOGIC:  Alert and oriented x 3 PSYCHIATRIC:  Normal affect   ASSESSMENT:    1. Coronary artery disease involving native coronary artery of native heart without angina pectoris   2. Mixed hyperlipidemia   3. Primary hypertension   4. S/P CABG  (coronary artery bypass graft)    PLAN:    In order of problems listed above:  CAD (coronary artery disease) CABG in 2013, Dr. Servando Snare following non-ST elevation myocardial infarction.  Prior EF was 45 to 50%, currently normal.  Echocardiogram from 2022 as well as nuclear stress test reviewed.  Overall reassuring.  Small infarct pattern noted but overall low risk with no ischemia.  Chronic shortness of breath has been  a pattern for him since his bypass.  Continue to encourage exercise.  Talked about stopping vaping.  He is no longer smoking tobacco.  Continue with goal-directed medical therapy, medical management high intensity statin aspirin 81 mg etc.  He will be getting his lab work with Dr. Manuella Ghazi in March.  HLD (hyperlipidemia) Currently on atorvastatin 80 mg.  It is been a while since he has had a lipid panel it looks like.  He will be getting this in March with his primary care physician.  Excellent.  Goal LDL less than 70.  Also on Zetia 10 mg.  HTN (hypertension) Overall well controlled.  Continue with metoprolol 25 mg.  No changes made.  Diet, exercise.  S/P CABG (coronary artery bypass graft) As described above.  2013.         Medication Adjustments/Labs and Tests Ordered: Current medicines are reviewed at length with the patient today.  Concerns regarding medicines are outlined above.  No orders of the defined types were placed in this encounter.  No orders of the defined types were placed in this encounter.   Patient Instructions  Medication Instructions:  Your physician recommends that you continue on your current medications as directed. Please refer to the Current Medication list given to you today.   Labwork: None   Testing/Procedures: None   Follow-Up: 1 year  Any Other Special Instructions Will Be Listed Below (If Applicable).  If you need a refill on your cardiac medications before your next appointment, please call your pharmacy.    Signed, Candee Furbish, MD  07/14/2021 8:50 AM    [facility]

## 2021-07-14 NOTE — Assessment & Plan Note (Signed)
CABG in 2013, Dr. Servando Snare following non-ST elevation myocardial infarction.  Prior EF was 45 to 50%, currently normal.  Echocardiogram from 2022 as well as nuclear stress test reviewed.  Overall reassuring.  Small infarct pattern noted but overall low risk with no ischemia.  Chronic shortness of breath has been a pattern for him since his bypass.  Continue to encourage exercise.  Talked about stopping vaping.  He is no longer smoking tobacco.  Continue with goal-directed medical therapy, medical management high intensity statin aspirin 81 mg etc.  He will be getting his lab work with Dr. Manuella Ghazi in March.

## 2021-07-14 NOTE — Assessment & Plan Note (Signed)
Overall well controlled.  Continue with metoprolol 25 mg.  No changes made.  Diet, exercise.

## 2021-07-14 NOTE — Assessment & Plan Note (Addendum)
Currently on atorvastatin 80 mg.  It is been a while since he has had a lipid panel it looks like.  He will be getting this in March with his primary care physician.  Excellent.  Goal LDL less than 70.  Also on Zetia 10 mg.

## 2021-11-28 ENCOUNTER — Other Ambulatory Visit: Payer: Self-pay

## 2021-11-28 MED ORDER — PANTOPRAZOLE SODIUM 40 MG PO TBEC: 40.0000 mg | DELAYED_RELEASE_TABLET | Freq: Every day | ORAL | 3 refills | Status: DC

## 2022-02-02 ENCOUNTER — Telehealth: Payer: Self-pay

## 2022-02-02 ENCOUNTER — Other Ambulatory Visit: Payer: Self-pay | Admitting: *Deleted

## 2022-02-02 MED ORDER — EZETIMIBE 10 MG PO TABS: 10.0000 mg | ORAL_TABLET | Freq: Every day | ORAL | 2 refills | Status: DC

## 2022-02-02 MED ORDER — ATORVASTATIN CALCIUM 80 MG PO TABS: 80.0000 mg | ORAL_TABLET | Freq: Every day | ORAL | 2 refills | Status: DC

## 2022-02-02 MED ORDER — METOPROLOL SUCCINATE ER 25 MG PO TB24: 25.0000 mg | ORAL_TABLET | Freq: Every day | ORAL | 3 refills | Status: DC

## 2022-02-02 NOTE — Addendum Note (Signed)
Addended by: Christella Scheuermann C on: 02/02/2022 04:07 PM   Modules accepted: Orders

## 2022-02-02 NOTE — Telephone Encounter (Signed)
Medication refill request approved for Zetia and sent to pharmacy.

## 2022-08-01 ENCOUNTER — Ambulatory Visit: Payer: No Typology Code available for payment source | Admitting: Internal Medicine

## 2022-08-28 ENCOUNTER — Ambulatory Visit: Payer: No Typology Code available for payment source | Admitting: Internal Medicine

## 2022-08-28 ENCOUNTER — Ambulatory Visit: Payer: Medicare HMO | Attending: Internal Medicine | Admitting: Internal Medicine

## 2022-08-28 ENCOUNTER — Encounter: Payer: Self-pay | Admitting: Internal Medicine

## 2022-08-28 VITALS — BP 138/82 | Ht 73.0 in | Wt 237.0 lb

## 2022-08-28 DIAGNOSIS — I2510 Atherosclerotic heart disease of native coronary artery without angina pectoris: Secondary | ICD-10-CM | POA: Diagnosis not present

## 2022-08-28 NOTE — Progress Notes (Signed)
Cardiology Office Note  Date: 08/28/2022   ID: Shawn Carey, DOB 1956-02-14, MRN [id number]  PCP:  Shawn Blitz, MD  Cardiologist:  None Electrophysiologist:  None   Reason for Office Visit: Follow-up of CAD   History of Present Illness: Shawn Carey is a 67 y.o. 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  Patient underwent NM stress test for DOE in 2022 that showed anteroapical infarct scar and very mild associated inferoapical peri-infarct ischemia, LVEF 57%. Study was low risk. Echocardiogram performed in 2022 showed normal LVEF and no evidence of LV thrombus. He presented today for follow-up visit.No interval ER visits/hospitalizations for chest pain. He has baseline SOB but can perform his ADLs with no SOB symptoms.  He gets out of breath only when he performs strenuous activities like lifting weights, pushing a heavy weight, doing some yard work.  Otherwise he can sleep at nighttime comfortably.  Denies dizziness, lightheadedness, syncope, leg swelling and claudication.  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   Tobacco abuse     Past Surgical History:  Procedure Laterality Date   CORONARY ARTERY BYPASS GRAFT  03/27/2012   Procedure: CORONARY ARTERY BYPASS GRAFTING (CABG);  Surgeon: Grace Isaac, MD;  Location: [facility];  Service: Open Heart Surgery;  Laterality: N/A;  Coronary Artery Bypass Grafting X 7 Using Left Internal Mammary Artery and Right Saphenous Leg Vein Harvested Endoscopically   LEFT HEART  CATHETERIZATION WITH CORONARY ANGIOGRAM N/A 03/25/2012   Procedure: LEFT HEART CATHETERIZATION WITH CORONARY ANGIOGRAM;  Surgeon: Hillary Bow, MD;  Location: [facility] CATH LAB;  Service: Cardiovascular;  Laterality: N/A;   PANCREATIC CYST BIOPSY  2021   THORACOTOMY     about 10 yrs ago   TONSILLECTOMY     TUMOR REMOVAL  2020   from pancreas    Current Outpatient Medications  Medication Sig Dispense Refill   aspirin EC 81 MG tablet Take 81 mg by mouth Brouse.     atorvastatin (LIPITOR) 80 MG tablet Take 1 tablet (80 mg total) by mouth Iiams. 90 tablet 2   ezetimibe (ZETIA) 10 MG tablet Take 1 tablet (10 mg total) by mouth Gingrich. 90 tablet 2   metoprolol succinate (TOPROL-XL) 25 MG 24 hr tablet Take 1 tablet (25 mg total) by mouth Kiss. 90 tablet 3   nitroGLYCERIN (NITROSTAT) 0.4 MG SL tablet Place 1 tablet (0.4 mg total) under the tongue every 5 (five) minutes x 3 doses as needed for chest pain (if no relief after second dose proceed to the ED or call 911). 25 tablet 3   pantoprazole (PROTONIX) 40 MG tablet Take 1 tablet (40 mg total) by mouth Alvarez. 90 tablet 3   No current facility-administered medications for this visit.   Allergies:  Other   Social History: The patient  reports that he quit smoking about 10 years ago. His smoking use included e-cigarettes and cigarettes. He started smoking about 47 years ago. He has a 45.00  pack-year smoking history. He has never used smokeless tobacco. He reports that he does not drink alcohol and does not use drugs.   Family History: The patient's family history includes Colon cancer in his paternal grandfather; Diabetes in an other family member; Heart disease in an other family member.   ROS:  Please see the history of present illness. Otherwise, complete review of systems is positive for none.  All other systems are reviewed and negative.   Physical Exam: VS:  Ht '6\' 1"'$  (1.854 m)   Wt 237 lb (107.5 kg)   BMI 31.27 kg/m , BMI Body mass index is  31.27 kg/m.  Wt Readings from Last 3 Encounters:  08/28/22 237 lb (107.5 kg)  07/14/21 235 lb 12.8 oz (107 kg)  03/08/21 234 lb (106.1 kg)    General: Patient appears comfortable at rest. HEENT: Conjunctiva and lids normal, oropharynx clear with moist mucosa. Neck: Supple, no elevated JVP or carotid bruits, no thyromegaly. Lungs: Clear to auscultation, nonlabored breathing at rest. Cardiac: Regular rate and rhythm, no S3 or significant systolic murmur, no pericardial rub. Abdomen: Soft, nontender, no hepatomegaly, bowel sounds present, no guarding or rebound. Extremities: No pitting edema, distal pulses 2+. Skin: Warm and dry. Musculoskeletal: No kyphosis. Neuropsychiatric: Alert and oriented x3, affect grossly appropriate.  ECG: NSR  Recent Labwork: No results found for requested labs within last 365 days.     Component Value Date/Time   CHOL 158 11/03/2016 1540   TRIG 256 (H) 11/03/2016 1540   HDL 29 (L) 11/03/2016 1540   CHOLHDL 6.0 07/09/2015 0630   VLDL 57 (H) 07/09/2015 0630   LDLCALC 78 11/03/2016 1540    Other Studies Reviewed Today: NM stress test in 05/2021   Findings are consistent with prior myocardial infarction with peri-infarct ischemia. The study is low risk.   No ST deviation was noted. The ECG was negative for ischemia.   LV perfusion is abnormal.  Small, moderate intensity, anteroapical defect that is fixed and suggestive of scar versus soft tissue attenuation.  There is minor inferoapical reversibility suggesting very mild peri-infarct ischemia.   Left ventricular function is normal. Nuclear stress EF: 57 %. Low risk study with region of anteroapical infarct scar and very mild associated inferoapical peri-infarct ischemia, LVEF 57%.  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  # CAD manifested by NSTEMI in 2013 s/p 7V CABG with normal LVEF, currently angina free -Continue aspirin 81 mg once Hair -Continue atorvastatin 80 mg nightly and Zetia 10 mg once Jeschke -Continue metoprolol succinate 25 mg once Hyer -SL NTG 0.4 mg as needed -ER precautions for chest pain  # HLD, unknown values -Continue atorvastatin 80 mg nightly and Zetia 10 mg once Pompei -Obtain lipid panel with PCP.  Goal LDL less than 70.  # HTN, controlled -Continue metoprolol XL 25 mg once Stitzer  I have spent a total of 30 minutes with patient reviewing chart, EKGs, labs and examining patient as well as establishing an assessment and plan that was discussed with the patient.  > 50% of time was spent in direct patient care.  Medication Adjustments/Labs and Tests Ordered: Current medicines are reviewed at length with the patient today.  Concerns regarding medicines are outlined above.   Tests Ordered: Orders Placed This Encounter  Procedures   EKG 12-Lead    Medication Changes: No orders of the defined types were placed in this encounter.   Disposition:  Follow up  1 year  Signed Eleonor Ocon Fidel Levy, MD, 08/28/2022 3:20 PM    [facility], [place], [place] [address]

## 2022-08-28 NOTE — Patient Instructions (Addendum)
Medication Instructions:  Your physician recommends that you continue on your current medications as directed. Please refer to the Current Medication list given to you today.  Labwork: none  Testing/Procedures: none  Follow-Up: Your physician recommends that you schedule a follow-up appointment in: 1 year. You will receive a reminder call in the mail in about 10 months reminding you to call and schedule your appointment. If you don't receive this call, please contact our office.  Any Other Special Instructions Will Be Listed Below (If Applicable).  If you need a refill on your cardiac medications before your next appointment, please call your pharmacy. 

## 2022-08-31 ENCOUNTER — Ambulatory Visit: Payer: No Typology Code available for payment source | Admitting: Internal Medicine

## 2022-12-01 ENCOUNTER — Other Ambulatory Visit: Payer: Self-pay | Admitting: Cardiology

## 2022-12-26 ENCOUNTER — Other Ambulatory Visit: Payer: Self-pay | Admitting: Cardiology

## 2023-01-30 ENCOUNTER — Telehealth: Payer: Self-pay | Admitting: Internal Medicine

## 2023-01-30 NOTE — Telephone Encounter (Signed)
Patient's wife stated that the patient tested positive for covid yesterday morning. Patient's wife is wanting to know if there is anything to be concerned about. Please advise.

## 2023-01-30 NOTE — Telephone Encounter (Signed)
Informed wife Eunice Blase) should see pcp for any covid related symptoms.  She verbalized understanding.

## 2023-02-09 ENCOUNTER — Other Ambulatory Visit: Payer: Self-pay | Admitting: Cardiology

## 2023-04-05 ENCOUNTER — Other Ambulatory Visit: Payer: Self-pay | Admitting: Cardiology

## 2023-07-19 ENCOUNTER — Telehealth: Payer: Self-pay | Admitting: *Deleted

## 2023-07-19 NOTE — Telephone Encounter (Signed)
Received referral from PCP for hospital follow up of acute pancreatitis.   Reviewed [facility] notes in Care Everywhere with Dr. Henreitta Leber.   Advised that patient has been seen at [facility] multiple times for previous pancreatic issues and would need to follow up there.   Referral denied.   Mamie Laurel, MD~ [facility] [address], [facility], [place], [place] [address]  [phone]  [phone] Valinda Hoar)  Rodman Comp, MD~ [facility] [address] [place], [place] [address] [phone] (Fax)

## 2023-09-25 ENCOUNTER — Other Ambulatory Visit: Payer: Self-pay | Admitting: Internal Medicine

## 2023-10-23 ENCOUNTER — Other Ambulatory Visit: Payer: Self-pay | Admitting: Internal Medicine

## 2023-10-29 ENCOUNTER — Other Ambulatory Visit: Payer: Self-pay | Admitting: Internal Medicine

## 2023-12-05 ENCOUNTER — Other Ambulatory Visit: Payer: Self-pay | Admitting: Internal Medicine

## 2023-12-22 ENCOUNTER — Other Ambulatory Visit: Payer: Self-pay | Admitting: Internal Medicine

## 2024-01-01 ENCOUNTER — Other Ambulatory Visit: Payer: Self-pay | Admitting: Internal Medicine

## 2024-01-17 ENCOUNTER — Other Ambulatory Visit: Payer: Self-pay | Admitting: Internal Medicine

## 2024-02-12 ENCOUNTER — Other Ambulatory Visit: Payer: Self-pay | Admitting: Internal Medicine

## 2024-02-25 ENCOUNTER — Other Ambulatory Visit: Payer: Self-pay | Admitting: Internal Medicine

## 2024-02-26 ENCOUNTER — Encounter: Payer: Self-pay | Admitting: Internal Medicine

## 2024-02-26 ENCOUNTER — Ambulatory Visit: Attending: Internal Medicine | Admitting: Internal Medicine

## 2024-02-26 VITALS — BP 148/80 | HR 56 | Ht 73.0 in | Wt 226.8 lb

## 2024-02-26 DIAGNOSIS — I2510 Atherosclerotic heart disease of native coronary artery without angina pectoris: Secondary | ICD-10-CM

## 2024-02-26 MED ORDER — NITROGLYCERIN 0.4 MG SL SUBL: 0.4000 mg | SUBLINGUAL_TABLET | SUBLINGUAL | 3 refills | Status: AC | PRN

## 2024-02-26 MED ORDER — PANTOPRAZOLE SODIUM 40 MG PO TBEC: 40.0000 mg | DELAYED_RELEASE_TABLET | Freq: Every day | ORAL | 3 refills | Status: AC

## 2024-02-26 MED ORDER — AMLODIPINE BESYLATE 5 MG PO TABS: 5.0000 mg | ORAL_TABLET | Freq: Every day | ORAL | 3 refills | Status: AC

## 2024-02-26 NOTE — Patient Instructions (Addendum)
 Medication Instructions:  Your physician has recommended you make the following change in your medication:  Stop taking Metoprolol  Start taking Amlodipine  5 mg once Saleeby  Continue taking all other medications as prescribed   Labwork: None  Testing/Procedures: None  Follow-Up: Your physician recommends that you schedule a follow-up appointment in: 1 year. You will receive a reminder call in about 8 months reminding you to schedule your appointment. If you don't receive this call, please contact our office.   Any Other Special Instructions Will Be Listed Below (If Applicable). Thank you for choosing [facility]!     If you need a refill on your cardiac medications before your next appointment, please call your pharmacy.

## 2024-02-26 NOTE — Progress Notes (Signed)
 Cardiology Office Note  Date: 02/26/2024   ID: Shawn Shawn Carey, DOB 1955/11/19, MRN [id number]  PCP:  Maree Isles, MD  Cardiologist:  Diannah SHAUNNA Maywood, MD Electrophysiologist:  None   Reason for Office Visit: Follow-up of CAD, HLD, HTN   History of Present Illness: Arth Shawn Shawn Carey is a 68 y.o. 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  Patient underwent NM stress test for DOE in 2022 that showed anteroapical infarct scar and very mild associated inferoapical peri-infarct ischemia, LVEF 57%. Study was low risk. Echocardiogram performed in 2022 showed normal LVEF and no evidence of LV thrombus. He presented today for follow-up visit.  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  He does get short of breath with strenuous activities.  This has been ongoing since 2013 when he had bypass surgery.  He had throat pain prior to his bypass surgery.  No angina.  No dizziness, lightheadedness, palpitations and leg swelling.  He quit smoking after his CABG.  He continues to eat unhealthy food, [place] fries etc.  Does not check blood pressures at home.   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   Tobacco abuse     Past Surgical History:  Procedure Laterality Date   CORONARY ARTERY BYPASS GRAFT  03/27/2012   Procedure: CORONARY ARTERY BYPASS GRAFTING (CABG);  Surgeon: Dallas KATHEE Jude, MD;  Location: [facility] OR;  Service: Open Heart Surgery;  Laterality: N/A;  Coronary Artery Bypass Grafting X 7 Using Left Internal  Mammary Artery and Right Saphenous Leg Vein Harvested Endoscopically   LEFT HEART CATHETERIZATION WITH CORONARY ANGIOGRAM N/A 03/25/2012   Procedure: LEFT HEART CATHETERIZATION WITH CORONARY ANGIOGRAM;  Surgeon: Debby JONETTA Como, MD;  Location: [facility] CATH LAB;  Service: Cardiovascular;  Laterality: N/A;   PANCREATIC CYST BIOPSY  2021   THORACOTOMY     about 10 yrs ago   TONSILLECTOMY     TUMOR REMOVAL  2020   from pancreas    Current Outpatient Medications  Medication Sig Dispense Refill   aspirin  EC 81 MG tablet Take 81 mg by mouth Quinby.     atorvastatin  (LIPITOR ) 80 MG tablet Take 1 tablet by mouth once Carreto 30 tablet 1   diphenhydrAMINE  HCl, Sleep, (ZZZQUIL) 50 MG/30ML LIQD Take 50 mg by mouth as needed (sleep).     ezetimibe  (ZETIA ) 10 MG tablet Take 1 tablet (10 mg total) by mouth Kilburg. 90 tablet 0   metoprolol  succinate (TOPROL -XL) 25 MG 24 hr tablet Take 1 tablet by mouth once Culp 90 tablet 1   nitroGLYCERIN  (NITROSTAT ) 0.4 MG SL tablet Place 1 tablet (0.4 mg total) under the tongue every 5 (five) minutes x 3 doses as needed for chest pain (if no relief after second dose proceed to the ED or call 911). 25 tablet 3   pantoprazole  (PROTONIX ) 40 MG tablet Take 1 tablet by mouth once Rotz 30 tablet 1   No current facility-administered medications for  this visit.   Allergies:  Other   Social History: The patient  reports that he quit smoking about 11 years ago. His smoking use included e-cigarettes and cigarettes. He started smoking about 48 years ago. He has a 55.1 pack-year smoking history. He has never used smokeless tobacco. He reports that he does not drink alcohol and does not use drugs.   Family History: The patient's family history includes Colon cancer in his paternal grandfather; Diabetes in an other family member; Heart disease in his brother, maternal grandmother, and another family member.   ROS:  Please see the history of present illness. Otherwise, complete review of  systems is positive for none.  All other systems are reviewed and negative.   Physical Exam: VS:  BP (!) 148/80   Pulse (!) 56   Ht 6' 1 (1.854 m)   Wt 226 lb 12.8 oz (102.9 kg)   SpO2 96%   BMI 29.92 kg/m , BMI Body mass index is 29.92 kg/m.  Wt Readings from Last 3 Encounters:  02/26/24 226 lb 12.8 oz (102.9 kg)  08/28/22 237 lb (107.5 kg)  07/14/21 235 lb 12.8 oz (107 kg)    General: Patient appears comfortable at rest. HEENT: Conjunctiva and lids normal, oropharynx clear with moist mucosa. Neck: Supple, no elevated JVP or carotid bruits, no thyromegaly. Lungs: Clear to auscultation, nonlabored breathing at rest. Cardiac: Regular rate and rhythm, no S3 or significant systolic murmur, no pericardial rub. Abdomen: Soft, nontender, no hepatomegaly, bowel sounds present, no guarding or rebound. Extremities: No pitting edema, distal pulses 2+. Skin: Warm and dry. Musculoskeletal: No kyphosis. Neuropsychiatric: Alert and oriented x3, affect grossly appropriate.  ECG: NSR  Recent Labwork: No results found for requested labs within last 365 days.     Component Value Date/Time   CHOL 158 11/03/2016 1540   TRIG 256 (H) 11/03/2016 1540   HDL 29 (L) 11/03/2016 1540   CHOLHDL 6.0 07/09/2015 0630   VLDL 57 (H) 07/09/2015 0630   LDLCALC 78 11/03/2016 1540    Other Studies Reviewed Today: NM stress test in 05/2021   Findings are consistent with prior myocardial infarction with peri-infarct ischemia. The study is low risk.   No ST deviation was noted. The ECG was negative for ischemia.   LV perfusion is abnormal.  Small, moderate intensity, anteroapical defect that is fixed and suggestive of scar versus soft tissue attenuation.  There is minor inferoapical reversibility suggesting very mild peri-infarct ischemia.   Left ventricular function is normal. Nuclear stress EF: 57 %. Low risk study with region of anteroapical infarct scar and very mild associated inferoapical peri-infarct  ischemia, LVEF 57%.  Echocardiogram 02/2021 LVEF normal No evidence of LV thrombus No valve abnormalities  Assessment and Plan:  # CAD manifested by NSTEMI in 2013 s/p 7V CABG with normal LVEF, currently angina free - Continue aspirin  81 mg once Dunivan - Continue atorvastatin  80 mg nightly and Zetia  10 mg once Climer. - SL NTG 0.4 mg as needed - ER precautions for chest pain  # HLD, not at goal - I reviewed lipid panel from 01/2023 that showed LDL 89, TG 180.  Not at goal.  Continue atorvastatin  80 mg nightly and Zetia  10 mg once Geurts.  Currently follows a healthy diet, strong encouraged to cut back on eating refined/processed/fried food.  Will allow 3 months to work on his diet and he has upcoming appointment with his PCP in November 2025 where he can get his lipids checked.  If  LDL continues to be more than 70 despite trying heart healthy diet, he will benefit from initiation of PCSK9 inhibitors or inclisiran.  Patient verbalized understanding.  # HTN, poorly controlled - Discontinue metoprolol  due to chronic fatigue.  Start amlodipine  5 mg once Velasques.  Does not check blood pressures at home.  Start checking blood pressures in a.m. and p.m.  Refilled medications.  I have spent a total of 30 minutes with patient reviewing chart, EKGs, labs and examining patient as well as establishing an assessment and plan that was discussed with the patient.  > 50% of time was spent in direct patient care.     Medication Adjustments/Labs and Tests Ordered: Current medicines are reviewed at length with the patient today.  Concerns regarding medicines are outlined above.   Tests Ordered: Orders Placed This Encounter  Procedures   EKG 12-Lead    Medication Changes: No orders of the defined types were placed in this encounter.   Disposition:  Follow up 1 year  Signed Susane Bey Arleta Maywood, MD, 02/26/2024 10:48 AM    [facility] [address] [place], [place], [place]  [address]

## 2024-02-28 ENCOUNTER — Other Ambulatory Visit: Payer: Self-pay | Admitting: Cardiology

## 2024-03-25 ENCOUNTER — Telehealth: Payer: Self-pay | Admitting: Internal Medicine

## 2024-03-25 NOTE — Telephone Encounter (Signed)
 Call returned to wife Delsie) - states that Jenene was at work and felt dizzy so they checked BP for him - says he has not taken his BP med yet because he takes in the evening.   Explained that we don't want to make any changes in medication based off of one reading.   Medication changed to Amlodipine  5mg  on 02/26/24 per Dr. Stacia office visit.    Suggested that he start taking the Amlodipine  in the morning even if he has to take to work with him.  Reminded to check BP 2 hours after morning medications with sitting 5-10 minutes prior.  Keep BP log over the next 10-14 days so we can see what he is truly trending.  She verbalized understanding.

## 2024-03-25 NOTE — Telephone Encounter (Signed)
 Pt c/o BP issue: STAT if pt c/o blurred vision, one-sided weakness or slurred speech.  STAT if BP is GREATER than 180/120 TODAY.  STAT if BP is LESS than 90/60 and SYMPTOMATIC TODAY  1. What is your BP concern?   Wife stated patient BP has been trending high  2. Have you taken any BP medication today?  Yes  3. What are your last 5 BP readings?  BP 177/110  HR 80 (today while patient was at work)  4. Are you having any other symptoms (ex. Dizziness, headache, blurred vision, passed out)?   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  Wife wants a call back on next steps.

## 2024-03-25 NOTE — Telephone Encounter (Signed)
 Wife Delsie) notified & verbalized understanding.

## 2024-03-26 ENCOUNTER — Encounter: Payer: Self-pay | Admitting: Internal Medicine

## 2024-04-03 NOTE — Progress Notes (Unsigned)
  Cardiology Office Note   Date:  04/03/2024  ID:  Shawn Carey, DOB 1955-12-01, MRN [id number] PCP: Maree Isles, MD  [facility] Providers Cardiologist:  Diannah SHAUNNA Maywood, MD { Click to update primary MD,subspecialty MD or APP then REFRESH:1}    History of Present Illness Shawn Carey is a 68 y.o. male with a PMH of CAD, s/p CABG x 7, HTN, HLD, hx of PAD, hx of TIA, who presents today for BP evaluation.   Previous cardiovascular history of NSTEMI in 2013 underwent CABG x 7, LVEF improved from 45 to 50% in 2013 to normal in 2022.  Noted to have a history of TIA in 2017.  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  More recently, nuclear medicine stress test for DOE in 2022 showed anteroapical infarct scar and very mild associated inferoapical peri-infarct ischemia, LVEF 57%, study found to be low risk.  Echo revealed normal LVEF.  Last seen by Dr. Mallipeddi on February 26, 2024.  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  Reported also eating [place] fries and unhealthy food, did not check BP at home.    ROS: ***  Studies Reviewed      *** Risk Assessment/Calculations {Does this patient have ATRIAL FIBRILLATION?:[phone]} No BP recorded.  {Refresh Note OR Click here to enter BP  :1}***       Physical Exam VS:  There were no vitals taken for this visit.       Wt Readings from Last 3 Encounters:  02/26/24 226 lb 12.8 oz (102.9 kg)  08/28/22 237 lb (107.5 kg)  07/14/21 235 lb 12.8 oz (107 kg)    GEN: Well nourished, well developed in no acute distress NECK: No JVD; No carotid bruits CARDIAC: ***RRR, no murmurs, rubs, gallops RESPIRATORY:  Clear to auscultation without rales, wheezing or rhonchi  ABDOMEN: Soft, non-tender, non-distended EXTREMITIES:  No edema; No deformity   ASSESSMENT AND PLAN ***    {Are you  ordering a CV Procedure (e.g. stress test, cath, DCCV, TEE, etc)?   Press F2        :[id number]}  Dispo: ***  Signed, Almarie Crate, NP

## 2024-04-04 ENCOUNTER — Encounter: Payer: Self-pay | Admitting: *Deleted

## 2024-04-04 ENCOUNTER — Ambulatory Visit: Attending: Nurse Practitioner | Admitting: Nurse Practitioner

## 2024-04-04 ENCOUNTER — Encounter: Payer: Self-pay | Admitting: Nurse Practitioner

## 2024-04-04 VITALS — BP 138/72 | HR 56 | Ht 73.0 in | Wt 227.0 lb

## 2024-04-04 DIAGNOSIS — I2510 Atherosclerotic heart disease of native coronary artery without angina pectoris: Secondary | ICD-10-CM

## 2024-04-04 DIAGNOSIS — I10 Essential (primary) hypertension: Secondary | ICD-10-CM | POA: Diagnosis not present

## 2024-04-04 DIAGNOSIS — I739 Peripheral vascular disease, unspecified: Secondary | ICD-10-CM | POA: Diagnosis not present

## 2024-04-04 DIAGNOSIS — Z8673 Personal history of transient ischemic attack (TIA), and cerebral infarction without residual deficits: Secondary | ICD-10-CM

## 2024-04-04 DIAGNOSIS — R0609 Other forms of dyspnea: Secondary | ICD-10-CM

## 2024-04-04 DIAGNOSIS — E785 Hyperlipidemia, unspecified: Secondary | ICD-10-CM | POA: Diagnosis not present

## 2024-04-04 NOTE — Patient Instructions (Addendum)
 Medication Instructions:   Continue all current medications.   Labwork:  none  Testing/Procedures:  Your physician has requested that you have an echocardiogram. Echocardiography is a painless test that uses sound waves to create images of your heart. It provides your doctor with information about the size and shape of your heart and how well your heart's chambers and valves are working. This procedure takes approximately one hour. There are no restrictions for this procedure. Please do NOT wear cologne, perfume, aftershave, or lotions (deodorant is allowed). Please arrive 15 minutes prior to your appointment time.  Please note: We ask at that you not bring children with you during ultrasound (echo/ vascular) testing. Due to room size and safety concerns, children are not allowed in the ultrasound rooms during exams. Our front office staff cannot provide observation of children in our lobby area while testing is being conducted. An adult accompanying a patient to their appointment will only be allowed in the ultrasound room at the discretion of the ultrasound technician under special circumstances. We apologize for any inconvenience. Office will contact with results via phone, letter or mychart.     Follow-Up:  3-4 months   Any Other Special Instructions Will Be Listed Below (If Applicable).  BP log  Salty six   If you need a refill on your cardiac medications before your next appointment, please call your pharmacy.

## 2024-04-10 ENCOUNTER — Other Ambulatory Visit: Payer: Self-pay | Admitting: Internal Medicine

## 2024-04-10 DIAGNOSIS — E785 Hyperlipidemia, unspecified: Secondary | ICD-10-CM

## 2024-04-15 ENCOUNTER — Ambulatory Visit: Attending: Nurse Practitioner

## 2024-04-15 DIAGNOSIS — R0609 Other forms of dyspnea: Secondary | ICD-10-CM | POA: Diagnosis not present

## 2024-04-15 MED ORDER — PERFLUTREN LIPID MICROSPHERE
1.0000 mL | INTRAVENOUS | Status: AC | PRN
  Administered 2024-04-15: 4 mL via INTRAVENOUS

## 2024-04-16 LAB — ECHOCARDIOGRAM COMPLETE
AR max vel: 3.86 cm2
AV Area VTI: 4.07 cm2
AV Area mean vel: 3.43 cm2
AV Mean grad: 4.2 mmHg
AV Peak grad: 7.9 mmHg
Ao pk vel: 1.4 m/s
Area-P 1/2: 2.97 cm2
Calc EF: 65.7 %
S' Lateral: 3.4 cm
Single Plane A2C EF: 69.8 %
Single Plane A4C EF: 58.2 %

## 2024-04-20 ENCOUNTER — Other Ambulatory Visit: Payer: Self-pay | Admitting: Internal Medicine

## 2024-04-21 ENCOUNTER — Ambulatory Visit: Payer: Self-pay | Admitting: Nurse Practitioner

## 2024-05-08 ENCOUNTER — Telehealth: Payer: Self-pay | Admitting: Pharmacist

## 2024-05-08 ENCOUNTER — Encounter: Payer: Self-pay | Admitting: Pharmacist

## 2024-05-08 ENCOUNTER — Telehealth: Payer: Self-pay | Admitting: Pharmacy Technician

## 2024-05-08 ENCOUNTER — Ambulatory Visit: Attending: Cardiology | Admitting: Pharmacist

## 2024-05-08 ENCOUNTER — Other Ambulatory Visit (HOSPITAL_COMMUNITY): Payer: Self-pay

## 2024-05-08 DIAGNOSIS — E7849 Other hyperlipidemia: Secondary | ICD-10-CM

## 2024-05-08 MED ORDER — REPATHA SURECLICK 140 MG/ML ~~LOC~~ SOAJ: 140.0000 mg | SUBCUTANEOUS | 3 refills | Status: DC

## 2024-05-08 NOTE — Telephone Encounter (Signed)
 Patient Advocate Encounter   The patient was approved for a [employer] grant that will help cover the cost of repatha Total amount awarded, 2500.  Effective: 04/08/24 - 04/07/25   APW:[id number] ERW:[id number] Hmnle:[id number] PI:[id number] [employer] ID: [id number]   Pharmacy provided with approval and processing information.

## 2024-05-08 NOTE — Progress Notes (Signed)
 Patient ID: Matas Waitman                 DOB: 1956/02/26                    MRN: [id number]      HPI: Shawn Carey is a 68 y.o. male patient referred to lipid clinic by Almarie Fredrickson, NP. PMH is significant for TIA, CAD, hx of MI s/p CABG, HTN, HLD, tobacco abuse   Patient presented today with his wife for lipid clinic. Tolerates Zetia  along with high intensity statin well without side effects. He admits his diet is not good. After recent change in BP meds his Home BP rennins at goal. He does not have structured exercise schedule but his work involve lots of physical work.  Reviewed options for lowering LDL cholesterol, including PCSK-9 inhibitors, bempedoic acid and inclisiran.  Discussed mechanisms of action, dosing, side effects and potential decreases in LDL cholesterol.  Also reviewed cost information and potential options for patient assistance.   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   Family History:  Mother (Deceased) Diabetes   Father (Deceased)   Brother Metallurgist) Heart disease     Maternal Grandmother (Deceased) Heart disease     Maternal Grandfather (Deceased)   Paternal Grandmother (Deceased)   Paternal Grandfather Colon cancer     Other - grandmother (Deceased at age 32s) Diabetes   Heart disease     Other - married Metallurgist)     Social History:  Vape - no nicotine  Smoking: quit 12 years ago  Labs:  Lipid Panel     Component Value Date/Time    CHOL 158 11/03/2016 1540   TRIG 256 (H) 11/03/2016 1540   HDL 29 (L) 11/03/2016 1540   CHOLHDL 6.0 07/09/2015 0630   VLDL 57 (H) 07/09/2015 0630   LDLCALC 78 11/03/2016 1540   LABVLDL 51 (H) 11/03/2016 1540    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 90 tablet 3   aspirin  EC 81 MG tablet Take 81 mg by mouth Murdy.     atorvastatin  (LIPITOR ) 80 MG tablet Take 1 tablet by mouth once Wetherbee 90 tablet 2   diphenhydrAMINE  HCl, Sleep, (ZZZQUIL) 50 MG/30ML LIQD Take 50 mg by mouth as needed (sleep).     ezetimibe  (ZETIA ) 10 MG tablet Take 1 tablet by mouth once Daniele 90 tablet 3   nitroGLYCERIN  (NITROSTAT ) 0.4 MG SL  tablet Place 1 tablet (0.4 mg total) under the tongue every 5 (five) minutes x 3 doses as needed for chest pain (if no relief after second dose proceed to the ED or call 911). 25 tablet 3   pantoprazole  (PROTONIX ) 40 MG tablet Take 1 tablet (40 mg total) by mouth Reza. 90 tablet 3   No current facility-administered medications on file prior to visit.    Allergies  Allergen Reactions   Other Swelling    peas    Assessment/Plan:  1. Hyperlipidemia -  Problem  Hld (Hyperlipidemia)   Current Medications: atorvastatin  80 mg Pennel and Zetia  10 mg Ellerbrock  Intolerances: none  Risk Factors: TIA, premature CAD, hx of MI s/p CABG, HTN, HLD, tobacco abuse  LDL goal: <55 mg/dl Last lab: 98/7974 TC 859,UH 132, LDLc 91 while on Atorvastatin  80 mg and Zetia  10 mg Lyman     HLD (hyperlipidemia) Assessment:  LDL goal: < 55 mg/dl last LDLc 91  mg/dl (98/7974) Tolerates Zetia  and  high intensity statins well without any side effects  Discussed next potential options (PCSK-9 inhibitors, bempedoic acid and inclisiran); cost, dosing efficacy, side effects  Reiterated importance of healthy diet and regular exercise   Plan: Continue taking current medications (atorvastatin  80 mg Mcclatchey and Zetia  10 mg Bose) Will apply for PA for PCSK9i; will inform patient upon approval  Lipid lab due in 2-3 months after therapy modification     Thank you,  Robbi Blanch, Pharm.D [facility]. [facility] [address], [place], [place] [address] Phone: [phone]; Fax: [phone]

## 2024-05-08 NOTE — Telephone Encounter (Signed)
 Pharmacy Patient Advocate Encounter  Received notification from [employer] that Prior Authorization for REPATHA has been APPROVED from 05/08/24 to 06/25/24. Ran test claim, Copay is $536.34. This test claim was processed through [facility]- copay amounts may vary at other pharmacies due to pharmacy/plan contracts, or as the patient moves through the different stages of their insurance plan.   PA #/Case ID/Reference #: [id number]  Got the patient a grant: The patient was approved for a [employer] grant that will help cover the cost of repatha Total amount awarded, 2500.  Effective: 04/08/24 - 04/07/25   APW:[id number] ERW:EKKEIFP Hmnle:[id number] PI:[id number] [employer] ID: [id number]  Lorrene information given to [facility]

## 2024-05-08 NOTE — Assessment & Plan Note (Signed)
 Assessment:  LDL goal: < 55 mg/dl last LDLc 91  mg/dl (98/7974) Tolerates Zetia  and high intensity statins well without any side effects  Discussed next potential options (PCSK-9 inhibitors, bempedoic acid and inclisiran); cost, dosing efficacy, side effects  Reiterated importance of healthy diet and regular exercise   Plan: Continue taking current medications (atorvastatin  80 mg Colella and Zetia  10 mg Wheeless) Will apply for PA for PCSK9i; will inform patient upon approval  Lipid lab due in 2-3 months after therapy modification

## 2024-05-08 NOTE — Addendum Note (Signed)
 Addended by: Mykelle Cockerell K on: 05/08/2024 04:29 PM   Modules accepted: Orders

## 2024-05-08 NOTE — Telephone Encounter (Signed)
   Pharmacy Patient Advocate Encounter   Received notification from Pt Calls Messages that prior authorization for REPATHA is required/requested.   Insurance verification completed.   The patient is insured through [facility].   Per test claim: PA required; PA submitted to above mentioned insurance via Latent Key/confirmation #/EOC [id number] Status is pending

## 2024-05-08 NOTE — Patient Instructions (Signed)
 Your Results:             Your most recent labs Goal  Total Cholesterol 140 < 200  Triglycerides 132 < 150  HDL (happy/good cholesterol) - > 40  LDL (lousy/bad cholesterol 91 < 55   Medication changes: continue taking  atorvastatin  80 mg Warman and Zetia  10 mg Heatwole We will start the process to get PCSK9i  covered by your insurance.  Once the prior authorization is complete, we will call you to let you know and confirm pharmacy information.     Praluent is a cholesterol medication that improved your body's ability to get rid of bad cholesterol known as LDL. It can lower your LDL up to 60%. It is an injection that is given under the skin every 2 weeks. The most common side effects of Praluent include runny nose, symptoms of the common cold, rarely flu or flu-like symptoms, back/muscle pain in about 3-4% of the patients, and redness, pain, or bruising at the injection site.    Repatha is a cholesterol medication that improved your body's ability to get rid of bad cholesterol known as LDL. It can lower your LDL up to 60%!  We will take care of submitting all the necessary information to your insurance company to get it approved. The most common side effects of Repatha include runny nose, symptoms of the common cold, rarely flu or flu-like symptoms, back/muscle pain in about 3-4% of the patients, and redness, pain, or bruising at the injection site.   Lab orders: We want to repeat labs after 2-3 months.  We will send you a lab order to remind you once we get closer to that time.

## 2024-05-19 NOTE — Telephone Encounter (Signed)
 Called pt as follow-up to confirm he was able to start Repatha  with assistance from grant. He confirms he picked up and taken from pharmacy without issue.   Also spoke with wife, who stated that PCP was inquiring as to why metoprolol  was changed to amlodipine . After chart review of visit with Dr. Mallipeddi, informed patient and wife that change was made due to chronic fatigue and uncontrolled hypertension with metoprolol  use. After discussion patient does recall this was reason for change.   Thanks,   Treyven Lafauci, Pharm.D Candidate

## 2024-07-08 ENCOUNTER — Other Ambulatory Visit: Payer: Self-pay

## 2024-07-08 ENCOUNTER — Encounter: Payer: Self-pay | Admitting: Internal Medicine

## 2024-07-08 DIAGNOSIS — E785 Hyperlipidemia, unspecified: Secondary | ICD-10-CM

## 2024-07-08 DIAGNOSIS — E7849 Other hyperlipidemia: Secondary | ICD-10-CM

## 2024-07-08 MED ORDER — NEXLIZET 180-10 MG PO TABS: 1.0000 | ORAL_TABLET | Freq: Every day | ORAL | 3 refills | Status: AC

## 2024-07-10 ENCOUNTER — Ambulatory Visit: Payer: Self-pay | Admitting: Internal Medicine

## 2024-07-10 LAB — URIC ACID: Uric Acid: 4.9 mg/dL (ref 3.8–8.4)

## 2024-07-16 ENCOUNTER — Telehealth: Payer: Self-pay | Admitting: Pharmacy Technician

## 2024-07-16 ENCOUNTER — Other Ambulatory Visit (HOSPITAL_COMMUNITY): Payer: Self-pay

## 2024-07-16 NOTE — Telephone Encounter (Signed)
 Effective: 04/08/24 - 04/07/25   APW:[id number] ERW:EKKEIFP Hmnle:[id number] PI:[id number] [employer] ID: [id number]  Covers the repatha  and nexlizet    Given to [facility]/patient

## 2024-07-25 ENCOUNTER — Ambulatory Visit: Attending: Nurse Practitioner | Admitting: Nurse Practitioner

## 2024-07-25 VITALS — BP 130/84 | HR 77 | Ht 74.0 in | Wt 222.2 lb

## 2024-07-25 DIAGNOSIS — E785 Hyperlipidemia, unspecified: Secondary | ICD-10-CM | POA: Diagnosis not present

## 2024-07-25 DIAGNOSIS — I2510 Atherosclerotic heart disease of native coronary artery without angina pectoris: Secondary | ICD-10-CM | POA: Diagnosis not present

## 2024-07-25 DIAGNOSIS — I739 Peripheral vascular disease, unspecified: Secondary | ICD-10-CM

## 2024-07-25 DIAGNOSIS — R42 Dizziness and giddiness: Secondary | ICD-10-CM | POA: Diagnosis not present

## 2024-07-25 DIAGNOSIS — I10 Essential (primary) hypertension: Secondary | ICD-10-CM | POA: Diagnosis not present

## 2024-07-25 DIAGNOSIS — R059 Cough, unspecified: Secondary | ICD-10-CM | POA: Diagnosis not present

## 2024-07-25 DIAGNOSIS — R0602 Shortness of breath: Secondary | ICD-10-CM | POA: Diagnosis not present

## 2024-07-25 DIAGNOSIS — Z8673 Personal history of transient ischemic attack (TIA), and cerebral infarction without residual deficits: Secondary | ICD-10-CM

## 2024-07-25 NOTE — Patient Instructions (Signed)
 Medication Instructions:  Your physician recommends that you continue on your current medications as directed. Please refer to the Current Medication list given to you today.   Labwork: None  Testing/Procedures: None  Follow-Up: Your physician recommends that you schedule a follow-up appointment in: 3 months  Any Other Special Instructions Will Be Listed Below (If Applicable). Please go to Urgent Care and request a 2-view Chest X-Ray  Thank you for choosing [facility]!     If you need a refill on your cardiac medications before your next appointment, please call your pharmacy.

## 2024-07-25 NOTE — Progress Notes (Unsigned)
 " Cardiology Office Note   Date: 07/25/2024 ID:  Shawn Carey, DOB 12-20-1955, MRN [id number] PCP: Shawn Isles, MD  [facility] Providers Cardiologist:  Shawn Carey Maywood, MD     History of Present Illness Shawn Carey is a 69 y.o. male with a PMH of CAD, s/p CABG x 7, HTN, HLD, hx of PAD, hx of TIA, who presents today for scheduled follow-up.   Previous cardiovascular history of NSTEMI in 2013 underwent CABG x 7, LVEF improved from 45 to 50% in 2013 to normal in 2022.  Noted to have a history of TIA in 2017.  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  More recently, nuclear medicine stress test for [facility] in 2022 showed anteroapical infarct scar and very mild associated inferoapical peri-infarct ischemia, LVEF 57%, study found to be low risk.  Echo revealed normal LVEF.  Last seen by Dr. Mallipeddi on February 26, 2024.  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  Reported also eating French fries and unhealthy food, did not check BP at home.  04/04/2024 - Today he presents for BP evaluation with his wife. He states he got dizzy at work once and noticed his BP was 177/110.  Drinks caffeine all day per wife's report and does report being dehydrated.  Denies any excess salt intake.  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 Denies any chest pain, palpitations, syncope, presyncope, orthopnea, PND, swelling or significant weight changes, acute bleeding, or claudication.  07/25/2024 - Here for follow-up. Says he is not feeling well and wearing a mask today. Says he has not been feeling well since this past Monday. Already has  been tested for the flu and for Covid, both tests came back negative. Wonders if he has been running a fever, does report productive cough. Says he has been short of breath with his symptoms, couple times has been feeling dizzy with his symptoms and feels close to blacking out. Denies any chest pain, palpitations, syncope, presyncope, orthopnea, PND, swelling or significant weight changes, acute bleeding, or claudication.  ROS: Negative. See HPI.  SH: He is a former tobacco user, seldom drinks alcohol, does vape without nicotine, but denies any other illicit drug use.  Studies Reviewed  EKG: EKG is not ordered today.  Echo 03/2024:  1. Apical hypokinesis.. Left ventricular ejection fraction, by  estimation, is 55 to 60%. The left ventricle has normal function. The left  ventricle has no regional wall motion abnormalities. Left ventricular  diastolic parameters are indeterminate.   2. Right ventricular systolic function is normal. The right ventricular  size is normal.   3. The mitral valve is normal in structure. Trivial mitral valve  regurgitation. No evidence of mitral stenosis.   4. The aortic valve is tricuspid. Aortic valve regurgitation is not  visualized. No aortic stenosis is present.   5. The inferior vena cava is normal in size with greater than 50%  respiratory variability, suggesting right atrial pressure of 3 mmHg.   Comparison(s): A prior study was performed on 05/23/2021. EF 55-60%. The  apex was severely hypokinetic/akinetic. The apical anteroseptal LV segment  is hypokinetic.  Lexiscan  05/2021:   Findings  are consistent with prior myocardial infarction with peri-infarct ischemia. The study is low risk.   No ST deviation was noted. The ECG was negative for ischemia.   LV perfusion is abnormal.  Small, moderate intensity, anteroapical defect that is fixed and suggestive of scar versus soft tissue attenuation.  There is minor inferoapical reversibility suggesting very mild  peri-infarct ischemia.   Left ventricular function is normal. Nuclear stress EF: 57 %.   Low risk study with region of anteroapical infarct scar and very mild associated inferoapical peri-infarct ischemia, LVEF 57%.  Limited echo 04/2021:  1. Left ventricular ejection fraction, by estimation, is 55 to 60%. The  left ventricle has normal function. The left ventricle demonstrates  regional wall motion abnormalities. The apex is severely  hypokinetic/akinetic. The apical anteroseptal LV segment   is hyopkinetic.   2. No LV thrombus visualized on definity  contrast images.  Physical Exam VS:  BP 130/84   Pulse 77   Ht 6' 2 (1.88 m)   Wt 222 lb 3.2 oz (100.8 kg)   SpO2 94%   BMI 28.53 kg/m        Wt Readings from Last 3 Encounters:  07/25/24 222 lb 3.2 oz (100.8 kg)  04/04/24 227 lb (103 kg)  02/26/24 226 lb 12.8 oz (102.9 kg)    GEN: Well nourished, well developed in no acute distress NECK: No JVD; No carotid bruits CARDIAC: S1/S2, RRR, no murmurs, rubs, gallops RESPIRATORY:  Diminished to auscultation with rhonchi noted to bases and some wheezing, no rales ABDOMEN: Soft, non-tender, non-distended EXTREMITIES:  No edema; No deformity   ASSESSMENT AND PLAN  CAD, s/p CABG x 7 Stable with no anginal symptoms. No indication for ischemic evaluation.  Continue aspirin , atorvastatin , Nexlizet , and nitroglycerin  as needed. Heart healthy diet and regular cardiovascular exercise encouraged. Care and ED precautions discussed.   Hypertension BP stable today. Discussed lifestyle changes and he verbalized understanding.  Continue amlodipine . Heart healthy diet and regular cardiovascular exercise encouraged.   Hyperlipidemia Could not tolerate Repatha , just started Nexlizet  and tolerating well. Will recheck FLP/LFT in 3 months or at next office visit.  Heart healthy diet and regular cardiovascular exercise encouraged.   PAD, hx of TIA  Past CT angiogram revealed occluded right vertebral  artery.  Denies any symptoms.  Continue current medication regimen.  Continue follow-up with PCP. Heart healthy diet and regular cardiovascular exercise encouraged.   5.  Cough, shortness of breath, dizziness Recent illness symptoms, possible may have PNA. Recommended he proceed to go to urgent care after this office visit for evaluation. Most recent Echo benign. Care and ED precautions discussed. Continue to follow with PCP.   Dispo: Care and ED precautions discussed.  Follow-up with MD/APP in 3 months or sooner if anything changes.  Signed, Almarie Crate, NP   "

## 2024-10-23 ENCOUNTER — Ambulatory Visit: Admitting: Nurse Practitioner
